# Patient Record
Sex: Male | Born: 1990 | Hispanic: No | Marital: Single | Smoking: Never smoker
Health system: Southern US, Community
[De-identification: ages and names within clinical notes are randomized; demographics above are authoritative.]

## PROBLEM LIST (undated history)

## (undated) DIAGNOSIS — G43909 Migraine, unspecified, not intractable, without status migrainosus: Secondary | ICD-10-CM

## (undated) DIAGNOSIS — K219 Gastro-esophageal reflux disease without esophagitis: Secondary | ICD-10-CM

## (undated) DIAGNOSIS — B019 Varicella without complication: Secondary | ICD-10-CM

## (undated) DIAGNOSIS — R519 Headache, unspecified: Secondary | ICD-10-CM

## (undated) DIAGNOSIS — R51 Headache: Secondary | ICD-10-CM

## (undated) HISTORY — DX: Headache, unspecified: R51.9

## (undated) HISTORY — DX: Gastro-esophageal reflux disease without esophagitis: K21.9

## (undated) HISTORY — DX: Headache: R51

## (undated) HISTORY — DX: Varicella without complication: B01.9

## (undated) HISTORY — DX: Migraine, unspecified, not intractable, without status migrainosus: G43.909

---

## 2015-07-05 ENCOUNTER — Ambulatory Visit (INDEPENDENT_AMBULATORY_CARE_PROVIDER_SITE_OTHER): Payer: BC Managed Care – PPO | Admitting: Adult Health

## 2015-07-05 ENCOUNTER — Encounter: Payer: Self-pay | Admitting: Adult Health

## 2015-07-05 VITALS — BP 100/76 | Temp 98.2°F | Ht 72.5 in | Wt 233.0 lb

## 2015-07-05 DIAGNOSIS — Z0000 Encounter for general adult medical examination without abnormal findings: Secondary | ICD-10-CM

## 2015-07-05 DIAGNOSIS — R51 Headache: Secondary | ICD-10-CM | POA: Diagnosis not present

## 2015-07-05 DIAGNOSIS — G43909 Migraine, unspecified, not intractable, without status migrainosus: Secondary | ICD-10-CM | POA: Insufficient documentation

## 2015-07-05 DIAGNOSIS — K219 Gastro-esophageal reflux disease without esophagitis: Secondary | ICD-10-CM

## 2015-07-05 DIAGNOSIS — R519 Headache, unspecified: Secondary | ICD-10-CM | POA: Insufficient documentation

## 2015-07-05 MED ORDER — PANTOPRAZOLE SODIUM 40 MG PO TBEC: 40.0000 mg | DELAYED_RELEASE_TABLET | Freq: Every day | ORAL | Status: DC

## 2015-07-05 NOTE — Progress Notes (Signed)
Pre visit review using our clinic review tool, if applicable. No additional management support is needed unless otherwise documented below in the visit note. 

## 2015-07-05 NOTE — Progress Notes (Signed)
HPI:  Riley Davis is here to establish care/Physical. He is a pleasant 24 year old male with PMH of migraines and frequent headaches  Last PCP and physical: 4+ years Immunizations:UTD Diet:Does not follow a diet. Eats a lot of fast food.  Exercise:Does not exercise.  Dentist: Goes twice a year Eye: Checked yearly.   Has the following chronic problems that require follow up and concerns today:  GERD - He takes Protonix for stomach pain and GERD like symptoms. He was placed on this about 2 weeks ago by Urgent Care. Stomach feels better since being on this medication.   Frequent headache - Happens once a week, usually at the end of the day and more so during the school year. He is able to control the headache with one advil. His last migraine was a year ago.    ROS negative for unless reported above: fevers, chills,feeling poorly, unintentional weight loss, hearing or vision loss, chest pain, palpitations, leg claudication, struggling to breath,Not feeling congested in the chest, no orthopenia, no cough,no wheezing, normal appetite, no soft tissue swelling, no hemoptysis, melena, hematochezia, hematuria, falls, loc, si, or thoughts of self harm.  Past Medical History  Diagnosis Date  . Migraines   . Chicken pox   . Frequent headaches   . GERD (gastroesophageal reflux disease)     No past surgical history on file.  Family History  Problem Relation Age of Onset  . Cancer Father     lung cancer    Social History   Social History  . Marital Status: Single    Spouse Name: N/A  . Number of Children: N/A  . Years of Education: N/A   Social History Main Topics  . Smoking status: Never Smoker   . Smokeless tobacco: None  . Alcohol Use: 0.0 oz/week    0 Standard drinks or equivalent per week     Comment: socailly   . Drug Use: No  . Sexual Activity: Not Asked   Other Topics Concern  . 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    Not married         Current outpatient prescriptions:  .  pantoprazole (PROTONIX) 40 MG tablet, Take 1 tablet (40 mg total) by mouth daily., Disp: 30 tablet, Rfl: 2  EXAM:  Filed Vitals:   07/05/15 1351  BP: 100/76  Temp: 98.2 F (36.8 C)    Body mass index is 31.15 kg/(m^2).  GENERAL: vitals reviewed and listed above, alert, oriented, appears well hydrated and in no acute distress. Slightly obese  HEENT: atraumatic, conjunttiva clear, no obvious abnormalities on inspection of external nose and ears  NECK: Neck is soft and supple without masses, no adenopathy or thyromegaly, trachea midline, no JVD. Normal range of motion.   LUNGS: clear to auscultation bilaterally, no wheezes, rales or rhonchi, good air movement  CV: Regular rate and rhythm, normal S1/S2, no audible murmurs, gallops, or rubs. No carotid bruit and no peripheral edema.   MS: moves all extremities without noticeable abnormality. No edema noted  Abd: soft/nontender/nondistended/normal bowel sounds   Skin: warm and dry, no rash   Extremities: No clubbing, cyanosis, or edema. Capillary refill is WNL. Pulses intact bilaterally in upper and lower extremities.   Neuro: CN II-XII intact, sensation and reflexes normal throughout, 5/5 muscle strength in bilateral upper and lower extremities. Normal finger to nose. Normal rapid alternating movements.  PSYCH: pleasant and cooperative, no obvious depression or anxiety  ASSESSMENT AND PLAN: 1. Routine general medical examination at a health care facility - Basic metabolic panel; Future - CBC; Future - Hemoglobin A1c; Future - Hepatic function panel; Future - Lipid panel; Future - POCT urinalysis dipstick; Future - TSH; Future - HIV antibody; Future - Will follow up with regarding labs - Follow up as needed or in 1-2 years for CPE - Needs to stop eating fast food and start exercising  2. Gastroesophageal reflux disease without esophagitis - pantoprazole (PROTONIX) 40 MG  tablet; Take 1 tablet (40 mg total) by mouth daily.  Dispense: 30 tablet; Refill: 2 - Will trial for three months and then take off - Basic metabolic panel; Future - CBC; Future - Hemoglobin A1c; Future - Hepatic function panel; Future - Lipid panel; Future - POCT urinalysis dipstick; Future - TSH; Future - HIV antibody; Future  3. Frequent headaches - Continue with current medication regimen. Inform provider if headaches become more frequent or are not relieved with OTC medication - Basic metabolic panel; Future - CBC; Future - Hemoglobin A1c; Future - Hepatic function panel; Future - Lipid panel; Future - POCT urinalysis dipstick; Future - TSH; Future - HIV antibody; Future   -We reviewed the PMH, PSH, FH, SH, Meds and Allergies. -We provided refills for any medications we will prescribe as needed. -We addressed current concerns per orders and patient instructions. -We have asked for records for pertinent exams, studies, vaccines and notes from previous providers. -We have advised patient to follow up per instructions below.   -Patient advised to return or notify a provider immediately if symptoms worsen or persist or new concerns arise.  Patient Instructions  It was great meeting you today!  I have sent in a prescription to the pharmacy for Protonix.   Cut back on fast food and start exercising.    I will follow up with you regarding your labs.    Health Maintenance A healthy lifestyle and preventative care can promote health and wellness.  Maintain regular health, dental, and eye exams.  Eat a healthy diet. Foods like vegetables, fruits, whole grains, low-fat dairy products, and lean protein foods contain the nutrients you need and are low in calories. Decrease your intake of foods high in solid fats, added sugars, and salt. Get information about a proper diet from your health care provider, if necessary.  Regular physical exercise is one of the most important  things you can do for your health. Most adults should get at least 150 minutes of moderate-intensity exercise (any activity that increases your heart rate and causes you to sweat) each week. In addition, most adults need muscle-strengthening exercises on 2 or more days a week.   Maintain a healthy weight. The body mass index (BMI) is a screening tool to identify possible weight problems. It provides an estimate of body fat based on height and weight. Your health care provider can find your BMI and can help you achieve or maintain a healthy weight. For males 20 years and older:  A BMI below 18.5 is considered underweight.  A BMI of 18.5 to 24.9 is normal.  A BMI of 25 to 29.9 is considered overweight.  A BMI of 30 and above is considered obese.  Maintain normal blood lipids and cholesterol by exercising and minimizing your intake of saturated fat. Eat a balanced diet with plenty of fruits and vegetables. Blood tests for lipids and cholesterol should begin at age 21 and be repeated every 5 years. If your lipid or cholesterol  levels are high, you are over age 76, or you are at high risk for heart disease, you may need your cholesterol levels checked more frequently.Ongoing high lipid and cholesterol levels should be treated with medicines if diet and exercise are not working.  If you smoke, find out from your health care provider how to quit. If you do not use tobacco, do not start.  Lung cancer screening is recommended for adults aged 55-80 years who are at high risk for developing lung cancer because of a history of smoking. A yearly low-dose CT scan of the lungs is recommended for people who have at least a 30-pack-year history of smoking and are current smokers or have quit within the past 15 years. A pack year of smoking is smoking an average of 1 pack of cigarettes a day for 1 year (for example, a 30-pack-year history of smoking could mean smoking 1 pack a day for 30 years or 2 packs a day for  15 years). Yearly screening should continue until the smoker has stopped smoking for at least 15 years. Yearly screening should be stopped for people who develop a health problem that would prevent them from having lung cancer treatment.  If you choose to drink alcohol, do not have more than 2 drinks per day. One drink is considered to be 12 oz (360 mL) of beer, 5 oz (150 mL) of wine, or 1.5 oz (45 mL) of liquor.  Avoid the use of street drugs. Do not share needles with anyone. Ask for help if you need support or instructions about stopping the use of drugs.  High blood pressure causes heart disease and increases the risk of stroke. Blood pressure should be checked at least every 1-2 years. Ongoing high blood pressure should be treated with medicines if weight loss and exercise are not effective.  If you are 14-86 years old, ask your health care provider if you should take aspirin to prevent heart disease.  Diabetes screening involves taking a blood sample to check your fasting blood sugar level. This should be done once every 3 years after age 32 if you are at a normal weight and without risk factors for diabetes. Testing should be considered at a younger age or be carried out more frequently if you are overweight and have at least 1 risk factor for diabetes.  Colorectal cancer can be detected and often prevented. Most routine colorectal cancer screening begins at the age of 38 and continues through age 56. However, your health care provider may recommend screening at an earlier age if you have risk factors for colon cancer. On a yearly basis, your health care provider may provide home test kits to check for hidden blood in the stool. A small camera at the end of a tube may be used to directly examine the colon (sigmoidoscopy or colonoscopy) to detect the earliest forms of colorectal cancer. Talk to your health care provider about this at age 53 when routine screening begins. A direct exam of the colon  should be repeated every 5-10 years through age 52, unless early forms of precancerous polyps or small growths are found.  People who are at an increased risk for hepatitis B should be screened for this virus. You are considered at high risk for hepatitis B if:  You were born in a country where hepatitis B occurs often. Talk with your health care provider about which countries are considered high risk.  Your parents were born in a high-risk country and  you have not received a shot to protect against hepatitis B (hepatitis B vaccine).  You have HIV or AIDS.  You use needles to inject street drugs.  You live with, or have sex with, someone who has hepatitis B.  You are a man who has sex with other men (MSM).  You get hemodialysis treatment.  You take certain medicines for conditions like cancer, organ transplantation, and autoimmune conditions.  Hepatitis C blood testing is recommended for all people born from [birth year] through 1965 and any individual with known risk factors for hepatitis C.  Healthy men should no longer receive prostate-specific antigen (PSA) blood tests as part of routine cancer screening. Talk to your health care provider about prostate cancer screening.  Testicular cancer screening is not recommended for adolescents or adult males who have no symptoms. Screening includes self-exam, a health care provider exam, and other screening tests. Consult with your health care provider about any symptoms you have or any concerns you have about testicular cancer.  Practice safe sex. Use condoms and avoid high-risk sexual practices to reduce the spread of sexually transmitted infections (STIs).  You should be screened for STIs, including gonorrhea and chlamydia if:  You are sexually active and are younger than 24 years.  You are older than 24 years, and your health care provider tells you that you are at risk for this type of infection.  Your sexual activity has changed since you  were last screened, and you are at an increased risk for chlamydia or gonorrhea. Ask your health care provider if you are at risk.  If you are at risk of being infected with HIV, it is recommended that you take a prescription medicine daily to prevent HIV infection. This is called pre-exposure prophylaxis (PrEP). You are considered at risk if:  You are a man who has sex with other men (MSM).  You are a heterosexual man who is sexually active with multiple partners.  You take drugs by injection.  You are sexually active with a partner who has HIV.  Talk with your health care provider about whether you are at high risk of being infected with HIV. If you choose to begin PrEP, you should first be tested for HIV. You should then be tested every 3 months for as long as you are taking PrEP.  Use sunscreen. Apply sunscreen liberally and repeatedly throughout the day. You should seek shade when your shadow is shorter than you. Protect yourself by wearing long sleeves, pants, a wide-brimmed hat, and sunglasses year round whenever you are outdoors.  Tell your health care provider of new moles or changes in moles, especially if there is a change in shape or color. Also, tell your health care provider if a mole is larger than the size of a pencil eraser.  A one-time screening for abdominal aortic aneurysm (AAA) and surgical repair of large AAAs by ultrasound is recommended for men aged 65-75 years who are current or former smokers.  Stay current with your vaccines (immunizations). Document Released: 05/02/2008 Document Revised: 11/09/2013 Document Reviewed: 04/01/2011 [facility] Patient Information 2015 [place], [place]. This information is not intended to replace advice given to you by your health care provider. Make sure you discuss any questions you have with your health care provider.      Shirline Frees, AGNP

## 2015-07-05 NOTE — Patient Instructions (Addendum)
It was great meeting you today!  I have sent in a prescription to the pharmacy for Protonix.   Cut back on fast food and start exercising.    I will follow up with you regarding your labs.    Health Maintenance A healthy lifestyle and preventative care can promote health and wellness.  Maintain regular health, dental, and eye exams.  Eat a healthy diet. Foods like vegetables, fruits, whole grains, low-fat dairy products, and lean protein foods contain the nutrients you need and are low in calories. Decrease your intake of foods high in solid fats, added sugars, and salt. Get information about a proper diet from your health care provider, if necessary.  Regular physical exercise is one of the most important things you can do for your health. Most adults should get at least 150 minutes of moderate-intensity exercise (any activity that increases your heart rate and causes you to sweat) each week. In addition, most adults need muscle-strengthening exercises on 2 or more days a week.   Maintain a healthy weight. The body mass index (BMI) is a screening tool to identify possible weight problems. It provides an estimate of body fat based on height and weight. Your health care provider can find your BMI and can help you achieve or maintain a healthy weight. For males 20 years and older:  A BMI below 18.5 is considered underweight.  A BMI of 18.5 to 24.9 is normal.  A BMI of 25 to 29.9 is considered overweight.  A BMI of 30 and above is considered obese.  Maintain normal blood lipids and cholesterol by exercising and minimizing your intake of saturated fat. Eat a balanced diet with plenty of fruits and vegetables. Blood tests for lipids and cholesterol should begin at age 26 and be repeated every 5 years. If your lipid or cholesterol levels are high, you are over age 7, or you are at high risk for heart disease, you may need your cholesterol levels checked more frequently.Ongoing high lipid  and cholesterol levels should be treated with medicines if diet and exercise are not working.  If you smoke, find out from your health care provider how to quit. If you do not use tobacco, do not start.  Lung cancer screening is recommended for adults aged 55-80 years who are at high risk for developing lung cancer because of a history of smoking. A yearly low-dose CT scan of the lungs is recommended for people who have at least a 30-pack-year history of smoking and are current smokers or have quit within the past 15 years. A pack year of smoking is smoking an average of 1 pack of cigarettes a day for 1 year (for example, a 30-pack-year history of smoking could mean smoking 1 pack a day for 30 years or 2 packs a day for 15 years). Yearly screening should continue until the smoker has stopped smoking for at least 15 years. Yearly screening should be stopped for people who develop a health problem that would prevent them from having lung cancer treatment.  If you choose to drink alcohol, do not have more than 2 drinks per day. One drink is considered to be 12 oz (360 mL) of beer, 5 oz (150 mL) of wine, or 1.5 oz (45 mL) of liquor.  Avoid the use of street drugs. Do not share needles with anyone. Ask for help if you need support or instructions about stopping the use of drugs.  High blood pressure causes heart disease and increases the  risk of stroke. Blood pressure should be checked at least every 1-2 years. Ongoing high blood pressure should be treated with medicines if weight loss and exercise are not effective.  If you are 50-54 years old, ask your health care provider if you should take aspirin to prevent heart disease.  Diabetes screening involves taking a blood sample to check your fasting blood sugar level. This should be done once every 3 years after age 79 if you are at a normal weight and without risk factors for diabetes. Testing should be considered at a younger age or be carried out more  frequently if you are overweight and have at least 1 risk factor for diabetes.  Colorectal cancer can be detected and often prevented. Most routine colorectal cancer screening begins at the age of 40 and continues through age 51. However, your health care provider may recommend screening at an earlier age if you have risk factors for colon cancer. On a yearly basis, your health care provider may provide home test kits to check for hidden blood in the stool. A small camera at the end of a tube may be used to directly examine the colon (sigmoidoscopy or colonoscopy) to detect the earliest forms of colorectal cancer. Talk to your health care provider about this at age 24 when routine screening begins. A direct exam of the colon should be repeated every 5-10 years through age 51, unless early forms of precancerous polyps or small growths are found.  People who are at an increased risk for hepatitis B should be screened for this virus. You are considered at high risk for hepatitis B if:  You were born in a country where hepatitis B occurs often. Talk with your health care provider about which countries are considered high risk.  Your parents were born in a high-risk country and you have not received a shot to protect against hepatitis B (hepatitis B vaccine).  You have HIV or AIDS.  You use needles to inject street drugs.  You live with, or have sex with, someone who has hepatitis B.  You are a man who has sex with other men (MSM).  You get hemodialysis treatment.  You take certain medicines for conditions like cancer, organ transplantation, and autoimmune conditions.  Hepatitis C blood testing is recommended for all people born from [birth year] through 1964 and any individual with known risk factors for hepatitis C.  Healthy men should no longer receive prostate-specific antigen (PSA) blood tests as part of routine cancer screening. Talk to your health care provider about prostate cancer  screening.  Testicular cancer screening is not recommended for adolescents or adult males who have no symptoms. Screening includes self-exam, a health care provider exam, and other screening tests. Consult with your health care provider about any symptoms you have or any concerns you have about testicular cancer.  Practice safe sex. Use condoms and avoid high-risk sexual practices to reduce the spread of sexually transmitted infections (STIs).  You should be screened for STIs, including gonorrhea and chlamydia if:  You are sexually active and are younger than 24 years.  You are older than 24 years, and your health care provider tells you that you are at risk for this type of infection.  Your sexual activity has changed since you were last screened, and you are at an increased risk for chlamydia or gonorrhea. Ask your health care provider if you are at risk.  If you are at risk of being infected with HIV, it is  recommended that you take a prescription medicine daily to prevent HIV infection. This is called pre-exposure prophylaxis (PrEP). You are considered at risk if:  You are a man who has sex with other men (MSM).  You are a heterosexual man who is sexually active with multiple partners.  You take drugs by injection.  You are sexually active with a partner who has HIV.  Talk with your health care provider about whether you are at high risk of being infected with HIV. If you choose to begin PrEP, you should first be tested for HIV. You should then be tested every 3 months for as long as you are taking PrEP.  Use sunscreen. Apply sunscreen liberally and repeatedly throughout the day. You should seek shade when your shadow is shorter than you. Protect yourself by wearing long sleeves, pants, a wide-brimmed hat, and sunglasses year round whenever you are outdoors.  Tell your health care provider of new moles or changes in moles, especially if there is a change in shape or color. Also, tell  your health care provider if a mole is larger than the size of a pencil eraser.  A one-time screening for abdominal aortic aneurysm (AAA) and surgical repair of large AAAs by ultrasound is recommended for men aged 22-75 years who are current or former smokers.  Stay current with your vaccines (immunizations). Document Released: 05/02/2008 Document Revised: 11/09/2013 Document Reviewed: 04/01/2011 [facility] Patient Information 2015 [place], [place]. This information is not intended to replace advice given to you by your health care provider. Make sure you discuss any questions you have with your health care provider.

## 2015-07-06 ENCOUNTER — Other Ambulatory Visit (INDEPENDENT_AMBULATORY_CARE_PROVIDER_SITE_OTHER): Payer: BC Managed Care – PPO

## 2015-07-06 DIAGNOSIS — R519 Headache, unspecified: Secondary | ICD-10-CM

## 2015-07-06 DIAGNOSIS — R51 Headache: Secondary | ICD-10-CM

## 2015-07-06 DIAGNOSIS — K219 Gastro-esophageal reflux disease without esophagitis: Secondary | ICD-10-CM | POA: Diagnosis not present

## 2015-07-06 DIAGNOSIS — Z0000 Encounter for general adult medical examination without abnormal findings: Secondary | ICD-10-CM

## 2015-07-06 LAB — POCT URINALYSIS DIPSTICK
BILIRUBIN UA: NEGATIVE
Blood, UA: NEGATIVE
GLUCOSE UA: NEGATIVE
KETONES UA: NEGATIVE
LEUKOCYTES UA: NEGATIVE
Nitrite, UA: NEGATIVE
Protein, UA: NEGATIVE
Spec Grav, UA: 1.02
Urobilinogen, UA: 0.2
pH, UA: 7

## 2015-07-06 LAB — BASIC METABOLIC PANEL
BUN: 15 mg/dL (ref 6–23)
CALCIUM: 10.2 mg/dL (ref 8.4–10.5)
CHLORIDE: 102 meq/L (ref 96–112)
CO2: 30 meq/L (ref 19–32)
Creatinine, Ser: 0.96 mg/dL (ref 0.40–1.50)
GFR: 102.68 mL/min (ref >60.00)
GLUCOSE: 101 mg/dL — AB (ref 70–99)
Potassium: 4.5 mEq/L (ref 3.5–5.1)
SODIUM: 140 meq/L (ref 135–145)

## 2015-07-06 LAB — LIPID PANEL
CHOLESTEROL: 228 mg/dL — AB (ref 0–200)
HDL: 46.6 mg/dL (ref >39.00)
LDL Cholesterol: 157 mg/dL — ABNORMAL HIGH (ref 0–99)
NonHDL: 181.08
TRIGLYCERIDES: 122 mg/dL (ref 0.0–149.0)
Total CHOL/HDL Ratio: 5
VLDL: 24.4 mg/dL (ref 0.0–40.0)

## 2015-07-06 LAB — HEPATIC FUNCTION PANEL
ALBUMIN: 4.7 g/dL (ref 3.5–5.2)
ALT: 25 U/L (ref 0–53)
AST: 16 U/L (ref 0–37)
Alkaline Phosphatase: 68 U/L (ref 39–117)
Bilirubin, Direct: 0.1 mg/dL (ref 0.0–0.3)
TOTAL PROTEIN: 7.9 g/dL (ref 6.0–8.3)
Total Bilirubin: 0.6 mg/dL (ref 0.2–1.2)

## 2015-07-06 LAB — CBC
HEMATOCRIT: 50.1 % (ref 39.0–52.0)
Hemoglobin: 16.8 g/dL (ref 13.0–17.0)
MCHC: 33.6 g/dL (ref 30.0–36.0)
MCV: 90 fl (ref 78.0–100.0)
PLATELETS: 308 10*3/uL (ref 150.0–400.0)
RBC: 5.57 Mil/uL (ref 4.22–5.81)
RDW: 13.5 % (ref 11.5–15.5)
WBC: 7.1 10*3/uL (ref 4.0–10.5)

## 2015-07-06 LAB — TSH: TSH: 2.38 u[IU]/mL (ref 0.35–4.50)

## 2015-07-06 LAB — HEMOGLOBIN A1C: Hgb A1c MFr Bld: 5.5 % (ref 4.6–6.5)

## 2015-07-07 ENCOUNTER — Encounter: Payer: Self-pay | Admitting: Adult Health

## 2015-07-07 LAB — HIV ANTIBODY (ROUTINE TESTING W REFLEX): HIV 1&2 Ab, 4th Generation: NONREACTIVE

## 2015-08-19 ENCOUNTER — Encounter: Payer: Self-pay | Admitting: Adult Health

## 2015-09-22 ENCOUNTER — Encounter: Payer: Self-pay | Admitting: Adult Health

## 2015-09-29 ENCOUNTER — Encounter: Payer: Self-pay | Admitting: Adult Health

## 2015-09-29 ENCOUNTER — Ambulatory Visit (INDEPENDENT_AMBULATORY_CARE_PROVIDER_SITE_OTHER): Payer: BC Managed Care – PPO | Admitting: Adult Health

## 2015-09-29 VITALS — BP 110/80 | Temp 98.0°F | Ht 72.5 in | Wt 227.0 lb

## 2015-09-29 DIAGNOSIS — K219 Gastro-esophageal reflux disease without esophagitis: Secondary | ICD-10-CM

## 2015-09-29 DIAGNOSIS — G43809 Other migraine, not intractable, without status migrainosus: Secondary | ICD-10-CM

## 2015-09-29 MED ORDER — SUMATRIPTAN SUCCINATE 50 MG PO TABS: 50.0000 mg | ORAL_TABLET | ORAL | Status: DC | PRN

## 2015-09-29 NOTE — Patient Instructions (Signed)
It was great seeing you again!   Congrats on the weight loss, keep up the good work.   Use the Imitrex as soon as you feel the Migraine comes on. If this does not work for you, please let me know.

## 2015-09-29 NOTE — Progress Notes (Signed)
Pre visit review using our clinic review tool, if applicable. No additional management support is needed unless otherwise documented below in the visit note. 

## 2015-09-29 NOTE — Progress Notes (Signed)
Subjective:    Patient ID: Riley BerlinJoshua Choe Brackeen, male    DOB: 06/24/1991, 24 y.o.   MRN: [id number]  HPI 24 year old male who presents to the office today for follow up regarding his migraines and GERD.   GERD He has changed his diet and is eating more chicken and vegetables. He feels like his GERD has improved. He has lost six pounds since his last visit.   Migraines,  He continues to suffer from migraines. His last migraine was at the beginning of October and he had it for three days. He was using Excedrin migraine which was not helping. He does not get aura. He did not have blurred vision.     Review of Systems  Eyes: Negative.   Respiratory: Negative.   Gastrointestinal: Negative.   Endocrine: Negative.   Genitourinary: Negative.   Musculoskeletal: Negative.   Skin: Negative.   Neurological: Positive for headaches.  Hematological: Negative.   Psychiatric/Behavioral: Negative.   All other systems reviewed and are negative.  Past Medical History  Diagnosis Date  . Migraines   . Chicken pox   . Frequent headaches   . GERD (gastroesophageal reflux disease)     Social History   Social History  . Marital Status: Single    Spouse Name: N/A  . Number of Children: N/A  . Years of Education: N/A   Occupational History  . Not on file.   Social History Main Topics  . Smoking status: Never Smoker   . Smokeless tobacco: Not on file  . Alcohol Use: 0.0 oz/week    0 Standard drinks or equivalent per week     Comment: socailly   . Drug Use: No  . Sexual Activity: Not on file   Other Topics Concern  . 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    Not married       No past surgical history on file.  Family History  Problem Relation Age of Onset  . Cancer Father     lung cancer    No Known Allergies  Current Outpatient Prescriptions on File Prior to Visit  Medication Sig Dispense Refill  . pantoprazole (PROTONIX) 40 MG tablet Take  1 tablet (40 mg total) by mouth daily. 30 tablet 2   No current facility-administered medications on file prior to visit.    BP 110/80 mmHg  Temp(Src) 98 F (36.7 C) (Oral)  Ht 6' 0.5" (1.842 m)  Wt 227 lb (102.967 kg)  BMI 30.35 kg/m2       Objective:   Physical Exam  Constitutional: He is oriented to person, place, and time. He appears well-developed and well-nourished. No distress.  Cardiovascular: Normal rate, regular rhythm, normal heart sounds and intact distal pulses.  Exam reveals no friction rub.   No murmur heard. Pulmonary/Chest: Effort normal and breath sounds normal. No respiratory distress. He has no wheezes. He has no rales. He exhibits no tenderness.  Neurological: He is alert and oriented to person, place, and time.  Skin: Skin is warm and dry. No rash noted. He is not diaphoretic. No erythema. No pallor.  Psychiatric: He has a normal mood and affect. His behavior is normal. Judgment and thought content normal.  Nursing note and vitals reviewed.      Assessment & Plan:  1. Other type of nonintractable migraine - No migraines in the last 30 days.  - SUMAtriptan (IMITREX) 50 MG tablet; Take 1 tablet (50  mg total) by mouth every 2 (two) hours as needed for migraine. May repeat in 2 hours if headache persists or recurs.  Dispense: 10 tablet; Refill: 3  2. Gastroesophageal reflux disease without esophagitis - Much improved since being on protonix. Continue with weight loss. Hopefully we can take him off the protonix soon.

## 2015-10-04 ENCOUNTER — Telehealth: Payer: Self-pay | Admitting: Adult Health

## 2015-10-04 NOTE — Telephone Encounter (Signed)
Pt had another 'spell' at work today. Staff RN took vitals: HR 94, O2 99% BP was 142/92 at 1:15PM. His spell happened at 11:40. Should he make another appointment?

## 2015-10-05 ENCOUNTER — Telehealth: Payer: Self-pay | Admitting: Adult Health

## 2015-10-05 ENCOUNTER — Other Ambulatory Visit: Payer: Self-pay | Admitting: Adult Health

## 2015-10-05 MED ORDER — RANITIDINE HCL 300 MG PO TABS: 150.0000 mg | ORAL_TABLET | Freq: Every day | ORAL | Status: DC

## 2015-10-05 NOTE — Telephone Encounter (Signed)
PA for Pantoprazole 40mg  was denied. Has to fail a 30 day trial of high dose H2 blockers.

## 2015-10-05 NOTE — Telephone Encounter (Signed)
Zantac 300mg  sent in

## 2015-10-06 NOTE — Telephone Encounter (Signed)
Can we find out how often he is getting dizzy or lightheaded? If it is happening often then have him come and and we can talk about setting him up with an event monitor.

## 2015-10-06 NOTE — Telephone Encounter (Signed)
Called and spoke with pt and pt states it is whenever he gets really hot.  The last one was 2 days ago and last one was the Friday leading up to the Tuesday. Advised pt to come in to discuss an event monitor.  Pt is aware and will come in on 11.23.2016 at 8 am.

## 2015-10-11 ENCOUNTER — Encounter: Payer: Self-pay | Admitting: Adult Health

## 2015-10-11 ENCOUNTER — Ambulatory Visit (INDEPENDENT_AMBULATORY_CARE_PROVIDER_SITE_OTHER): Payer: BC Managed Care – PPO | Admitting: Adult Health

## 2015-10-11 VITALS — BP 108/80 | HR 80 | Temp 97.7°F | Ht 72.5 in | Wt 226.3 lb

## 2015-10-11 DIAGNOSIS — R42 Dizziness and giddiness: Secondary | ICD-10-CM | POA: Diagnosis not present

## 2015-10-11 DIAGNOSIS — H6591 Unspecified nonsuppurative otitis media, right ear: Secondary | ICD-10-CM | POA: Diagnosis not present

## 2015-10-11 LAB — CBC WITH DIFFERENTIAL/PLATELET
BASOS ABS: 0.1 10*3/uL (ref 0.0–0.1)
Basophils Relative: 0.6 % (ref 0.0–3.0)
EOS PCT: 3.2 % (ref 0.0–5.0)
Eosinophils Absolute: 0.3 10*3/uL (ref 0.0–0.7)
HEMATOCRIT: 50 % (ref 39.0–52.0)
Hemoglobin: 16.7 g/dL (ref 13.0–17.0)
LYMPHS ABS: 1.8 10*3/uL (ref 0.7–4.0)
LYMPHS PCT: 21.5 % (ref 12.0–46.0)
MCHC: 33.4 g/dL (ref 30.0–36.0)
MCV: 89 fl (ref 78.0–100.0)
MONOS PCT: 7.3 % (ref 3.0–12.0)
Monocytes Absolute: 0.6 10*3/uL (ref 0.1–1.0)
NEUTROS ABS: 5.7 10*3/uL (ref 1.4–7.7)
Neutrophils Relative %: 67.4 % (ref 43.0–77.0)
PLATELETS: 335 10*3/uL (ref 150.0–400.0)
RBC: 5.62 Mil/uL (ref 4.22–5.81)
RDW: 12.8 % (ref 11.5–15.5)
WBC: 8.4 10*3/uL (ref 4.0–10.5)

## 2015-10-11 LAB — TSH: TSH: 2.41 u[IU]/mL (ref 0.35–4.50)

## 2015-10-11 MED ORDER — AMOXICILLIN-POT CLAVULANATE 875-125 MG PO TABS: 1.0000 | ORAL_TABLET | Freq: Two times a day (BID) | ORAL | Status: DC

## 2015-10-11 NOTE — Progress Notes (Signed)
Subjective:    Patient ID: Riley Davis, male    DOB: 01/01/1991, 24 y.o.   MRN: [id number]  HPI  24 year old male who presents to the office today for dizziness. Over the last two weeks he has had two episodes while at school where he all of a sudden feels warm, becomes diaphoretic and has to sit down because he feels like he is going to pass out. He denies any events that precipitate his dizzy spells. Denies any feeling of palpitations, heart racing, chest pain, or SOB before these episodes. 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 Endorses that he does not handle hot room temperatures very well and it has been hot in the school building as of recently.   He endorses that he has had the band of pressure across the front of his head for the last week. Has been taking Imitrex which has helped slightly. Denies any ear pain or ear drainage.    Review of Systems  Constitutional: Negative.   Respiratory: Negative.   Cardiovascular: Negative.   Endocrine: Positive for heat intolerance.  Genitourinary: Negative.   Musculoskeletal: Negative.   Neurological: Positive for dizziness and headaches. Negative for weakness.  Psychiatric/Behavioral: Negative.   All other systems reviewed and are negative.  Past Medical History  Diagnosis Date  . Migraines   . Chicken pox   . Frequent headaches   . GERD (gastroesophageal reflux disease)     Social History   Social History  . Marital Status: Single    Spouse Name: N/A  . Number of Children: N/A  . Years of Education: N/A   Occupational History  . Not on file.   Social History Main Topics  . Smoking status: Never Smoker   . Smokeless tobacco: Not on file  . Alcohol Use: 0.0 oz/week    0 Standard drinks or equivalent per week     Comment: socailly   . Drug Use: No  . Sexual Activity: Not on file   Other Topics Concern  . 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    Not married       No past surgical history on file.  Family History  Problem Relation Age of Onset  . Cancer Father     lung cancer    No Known Allergies  Current Outpatient Prescriptions on File Prior to Visit  Medication Sig Dispense Refill  . pantoprazole (PROTONIX) 40 MG tablet Take 1 tablet (40 mg total) by mouth daily. 30 tablet 2  . ranitidine (ZANTAC) 300 MG tablet Take 0.5 tablets (150 mg total) by mouth at bedtime. 30 tablet 3  . SUMAtriptan (IMITREX) 50 MG tablet Take 1 tablet (50 mg total) by mouth every 2 (two) hours as needed for migraine. May repeat in 2 hours if headache persists or recurs. 10 tablet 3   No current facility-administered medications on file prior to visit.    BP 108/80 mmHg  Pulse 80  Temp(Src) 97.7 F (36.5 C) (Oral)  Ht 6' 0.5" (1.842 m)  Wt 226 lb 4.8 oz (102.649 kg)  BMI 30.25 kg/m2  SpO2 98%       Objective:   Physical Exam  Constitutional: He is oriented to person, place, and time. He appears well-developed and well-nourished. No distress.  HENT:  Head: Normocephalic and atraumatic.  Right Ear: External ear normal.  Left Ear: External ear normal.  Nose:  Nose normal.  Mouth/Throat: Oropharynx is clear and moist.  Cerumen impaction in bilateral ears. Once impaction was removed, his right TM was red and bulging.   Eyes: Conjunctivae and EOM are normal. Pupils are equal, round, and reactive to light. Right eye exhibits no discharge. Left eye exhibits no discharge.  Neck: Normal range of motion. Neck supple. No thyromegaly present.  Cardiovascular: Normal rate, regular rhythm, normal heart sounds and intact distal pulses.  Exam reveals no gallop and no friction rub.   No murmur heard. Pulmonary/Chest: Effort normal and breath sounds normal. No respiratory distress. He has no wheezes. He has no rales. He exhibits no tenderness.  Abdominal: Bowel sounds are normal. He exhibits no distension and no mass. There is no tenderness. There is  no rebound and no guarding.  Lymphadenopathy:    He has no cervical adenopathy.  Neurological: He is alert and oriented to person, place, and time.  Skin: Skin is warm and dry. No rash noted. He is not diaphoretic. No erythema. No pallor.  Psychiatric: He has a normal mood and affect. His behavior is normal. Judgment and thought content normal.  Vitals reviewed.      Assessment & Plan:  1. Dizziness - Do not think it is cardiac at this point. Will consider a holter monitor in the future if symptoms continue. May be related to Migraines. He would like to continue to try imitrex. Will consider referral to headache clinic.  - TSH - CBC with Differential/Platelet - Follow up if no improvement  2. Otitis media with effusion, right - amoxicillin-clavulanate (AUGMENTIN) 875-125 MG tablet; Take 1 tablet by mouth 2 (two) times daily.  Dispense: 20 tablet; Refill: 0

## 2015-10-11 NOTE — Patient Instructions (Addendum)
I have sent in a prescription for Augmentin- take this twice a day for 10 days.   I will follow up with you regarding your lab work.   Please let me know if these spells continue

## 2015-10-11 NOTE — Progress Notes (Signed)
Pre visit review using our clinic review tool, if applicable. No additional management support is needed unless otherwise documented below in the visit note. 

## 2015-10-16 ENCOUNTER — Encounter: Payer: Self-pay | Admitting: Adult Health

## 2015-10-17 ENCOUNTER — Encounter: Payer: Self-pay | Admitting: Adult Health

## 2015-10-17 ENCOUNTER — Ambulatory Visit (INDEPENDENT_AMBULATORY_CARE_PROVIDER_SITE_OTHER): Payer: BC Managed Care – PPO | Admitting: Adult Health

## 2015-10-17 VITALS — BP 114/80 | HR 104 | Temp 98.2°F | Ht 72.5 in | Wt 225.3 lb

## 2015-10-17 DIAGNOSIS — R42 Dizziness and giddiness: Secondary | ICD-10-CM

## 2015-10-17 DIAGNOSIS — K219 Gastro-esophageal reflux disease without esophagitis: Secondary | ICD-10-CM

## 2015-10-17 DIAGNOSIS — G43809 Other migraine, not intractable, without status migrainosus: Secondary | ICD-10-CM | POA: Diagnosis not present

## 2015-10-17 DIAGNOSIS — R1033 Periumbilical pain: Secondary | ICD-10-CM | POA: Diagnosis not present

## 2015-10-17 MED ORDER — PANTOPRAZOLE SODIUM 40 MG PO TBEC: 40.0000 mg | DELAYED_RELEASE_TABLET | Freq: Two times a day (BID) | ORAL | Status: DC

## 2015-10-17 NOTE — Progress Notes (Signed)
Pre visit review using our clinic review tool, if applicable. No additional management support is needed unless otherwise documented below in the visit note. 

## 2015-10-17 NOTE — Patient Instructions (Signed)
I am sorry you have to keep going through this   Someone will call you to schedule a ultra sound, set you up with the event monitor, and get you over to the headache clinic.   Take the Protonix twice a day.   Try your best to keep a diary of these events.

## 2015-10-17 NOTE — Progress Notes (Signed)
Subjective:    Patient ID: Riley Davis, male    DOB: January 14, 1991, 24 y.o.   MRN: [id number]  HPI  24 year old male who returns to the office today for continued symptom of dizziness.   2 days ago ( Sunday) he was at home, when he woke up his stomach " was feeling bad with a lot of nausea in the morning." The nausea was gone by the afternoon and he had a normal appetite. The day after ( Monday) he woke up in the morning and his stomach felt better. 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 He was able to go outside into the cool weather, once outside he felt better. This episode lasted about 20 minutes. During this episode he felt nauseated. Also on Monday he had a migraine, which he took one Imitrex, he endorses that this did not help. Continues to have a band of pressure across his head and in his right ear.   When he woke up his stomach felt " a little unsettled" and endorses having intermittent bouts of diarrhea with his nausea. He has not had a fever  He denies feeling like his heart was going to beat out of his chest, like he was having extra heart beats ,syncope or vomiting.   He has not completed his Amoxicillin treatment for otitis media with effusion. Has ran out of protonix and feels as though this helped with his stomach issues.     Review of Systems  Constitutional: Negative.   Respiratory: Negative.   Cardiovascular: Negative.   Gastrointestinal: Positive for nausea and diarrhea. Negative for vomiting, abdominal pain, constipation, blood in stool, abdominal distention and anal bleeding.  Neurological: Positive for dizziness and headaches. Negative for seizures, facial asymmetry, speech difficulty, weakness, light-headedness and numbness.  Hematological: Negative.   Psychiatric/Behavioral: Negative.   All other systems reviewed and are negative.      Objective:   Physical Exam    Constitutional: He is oriented to person, place, and time. He appears well-developed and well-nourished. No distress.  HENT:  Head: Normocephalic and atraumatic.  Right Ear: External ear normal.  Left Ear: External ear normal.  Nose: Nose normal.  Mouth/Throat: Oropharynx is clear and moist. No oropharyngeal exudate.  Otitis media has resolved  Eyes: Conjunctivae and EOM are normal. Pupils are equal, round, and reactive to light. Right eye exhibits no discharge. Left eye exhibits no discharge. No scleral icterus.  Neck: Normal range of motion. Neck supple. No thyromegaly present.  Cardiovascular: Normal rate, regular rhythm, normal heart sounds and intact distal pulses.  Exam reveals no gallop and no friction rub.   No murmur heard. Pulmonary/Chest: Effort normal and breath sounds normal. No respiratory distress. He has no wheezes. He has no rales. He exhibits no tenderness.  Abdominal: Soft. Bowel sounds are normal. He exhibits no distension and no mass. There is tenderness (with deep palpation to umbilical area). There is no rebound and no guarding.  Musculoskeletal: Normal range of motion. He exhibits no edema or tenderness.  Lymphadenopathy:    He has no cervical adenopathy.  Neurological: He is alert and oriented to person, place, and time.  Skin: Skin is warm and dry. No rash noted. He is not diaphoretic. No erythema. No pallor.  Psychiatric: He has a normal mood and affect. His behavior is normal. Judgment and thought content normal.  Nursing note and vitals reviewed.  Assessment & Plan:  1. Dizziness - I still do not think this is cardiac related. From exam and patient interview it appears to be more of a temperature intolerance. His TSH is normal.  - Advised to try opening windows in class room. Will r/o cardiac issue with event monitor.  - Cardiac event monitor  2. Gastroesophageal reflux disease without esophagitis - pantoprazole (PROTONIX) 40 MG tablet; Take 1 tablet  (40 mg total) by mouth 2 (two) times daily.  Dispense: 60 tablet; Refill: 6  3. Periumbilical abdominal pain - Possible gastric ulcer or colitis?. He has no fevers and does not feel acutely ill. Very little suspicion for early appendicitis.  - US Abdomen Complete; Future  4. Other type of nonintractable migraine - AMB referral to headache clinic

## 2015-10-18 ENCOUNTER — Telehealth: Payer: Self-pay | Admitting: Adult Health

## 2015-10-18 ENCOUNTER — Emergency Department (HOSPITAL_COMMUNITY): Payer: BC Managed Care – PPO

## 2015-10-18 ENCOUNTER — Emergency Department (HOSPITAL_COMMUNITY)
Admission: EM | Admit: 2015-10-18 | Discharge: 2015-10-18 | Disposition: A | Discharge status: Home / Self Care | Payer: BC Managed Care – PPO | Attending: Emergency Medicine | Admitting: Emergency Medicine

## 2015-10-18 ENCOUNTER — Encounter: Payer: Self-pay | Admitting: Adult Health

## 2015-10-18 ENCOUNTER — Encounter (HOSPITAL_COMMUNITY): Payer: Self-pay

## 2015-10-18 DIAGNOSIS — R634 Abnormal weight loss: Secondary | ICD-10-CM | POA: Insufficient documentation

## 2015-10-18 DIAGNOSIS — Z79899 Other long term (current) drug therapy: Secondary | ICD-10-CM | POA: Insufficient documentation

## 2015-10-18 DIAGNOSIS — Z792 Long term (current) use of antibiotics: Secondary | ICD-10-CM | POA: Insufficient documentation

## 2015-10-18 DIAGNOSIS — K219 Gastro-esophageal reflux disease without esophagitis: Secondary | ICD-10-CM | POA: Insufficient documentation

## 2015-10-18 DIAGNOSIS — Z8619 Personal history of other infectious and parasitic diseases: Secondary | ICD-10-CM | POA: Diagnosis not present

## 2015-10-18 DIAGNOSIS — G43909 Migraine, unspecified, not intractable, without status migrainosus: Secondary | ICD-10-CM | POA: Diagnosis not present

## 2015-10-18 DIAGNOSIS — R1110 Vomiting, unspecified: Secondary | ICD-10-CM | POA: Insufficient documentation

## 2015-10-18 DIAGNOSIS — R110 Nausea: Secondary | ICD-10-CM

## 2015-10-18 LAB — COMPREHENSIVE METABOLIC PANEL
ALBUMIN: 4.4 g/dL (ref 3.5–5.0)
ALT: 32 U/L (ref 17–63)
ANION GAP: 8 (ref 5–15)
AST: 20 U/L (ref 15–41)
Alkaline Phosphatase: 65 U/L (ref 38–126)
BUN: 10 mg/dL (ref 6–20)
CO2: 27 mmol/L (ref 22–32)
Calcium: 9.5 mg/dL (ref 8.9–10.3)
Chloride: 103 mmol/L (ref 101–111)
Creatinine, Ser: 0.95 mg/dL (ref 0.61–1.24)
GFR calc non Af Amer: >60 mL/min (ref >60)
GLUCOSE: 101 mg/dL — AB (ref 65–99)
POTASSIUM: 3.6 mmol/L (ref 3.5–5.1)
SODIUM: 138 mmol/L (ref 135–145)
TOTAL PROTEIN: 7.9 g/dL (ref 6.5–8.1)
Total Bilirubin: 0.9 mg/dL (ref 0.3–1.2)

## 2015-10-18 LAB — CBC WITH DIFFERENTIAL/PLATELET
BASOS ABS: 0 10*3/uL (ref 0.0–0.1)
Basophils Relative: 0 %
Eosinophils Absolute: 0.1 10*3/uL (ref 0.0–0.7)
Eosinophils Relative: 1 %
HEMATOCRIT: 49 % (ref 39.0–52.0)
HEMOGLOBIN: 16.4 g/dL (ref 13.0–17.0)
LYMPHS PCT: 15 %
Lymphs Abs: 1.4 10*3/uL (ref 0.7–4.0)
MCH: 29.9 pg (ref 26.0–34.0)
MCHC: 33.5 g/dL (ref 30.0–36.0)
MCV: 89.4 fL (ref 78.0–100.0)
MONO ABS: 0.5 10*3/uL (ref 0.1–1.0)
MONOS PCT: 5 %
NEUTROS ABS: 7.1 10*3/uL (ref 1.7–7.7)
NEUTROS PCT: 79 %
PLATELETS: 309 10*3/uL (ref 150–400)
RBC: 5.48 MIL/uL (ref 4.22–5.81)
RDW: 12.5 % (ref 11.5–15.5)
WBC: 9.1 10*3/uL (ref 4.0–10.5)

## 2015-10-18 LAB — URINALYSIS, ROUTINE W REFLEX MICROSCOPIC
Bilirubin Urine: NEGATIVE
Glucose, UA: NEGATIVE mg/dL
Hgb urine dipstick: NEGATIVE
Ketones, ur: NEGATIVE mg/dL
Leukocytes, UA: NEGATIVE
NITRITE: NEGATIVE
PROTEIN: NEGATIVE mg/dL
SPECIFIC GRAVITY, URINE: 1.014 (ref 1.005–1.030)
pH: 6 (ref 5.0–8.0)

## 2015-10-18 MED ORDER — ONDANSETRON HCL 4 MG PO TABS: 4.0000 mg | ORAL_TABLET | Freq: Four times a day (QID) | ORAL | Status: DC

## 2015-10-18 MED ORDER — ONDANSETRON 4 MG PO TBDP
4.0000 mg | ORAL_TABLET | Freq: Once | ORAL | Status: AC
  Administered 2015-10-18: 4 mg via ORAL
  Filled 2015-10-18: qty 1

## 2015-10-18 NOTE — Telephone Encounter (Signed)
Patient Name: Riley Davis  DOB: 03/18/1991    Initial Comment caller states head pressure and bad nausea   Nurse Assessment  Nurse: Renaldo FiddlerAdkins, RN, Raynelle FanningJulie Date/Time Lamount Cohen(Eastern Time): 10/18/2015 9:00:50 AM  Confirm and document reason for call. If symptomatic, describe symptoms. ---Caller states he has sinus pain and pressure an he is nauseated. States his throat feels scratchy an his eyes are red. Adds he was seen in the office on Tuesday for all of these sx. Adds he was told he was going to get a referral to a "headache clinic". Originally states he was calling to FU on ultrasound for ongoing nausea, feeling faint and dizzy and migraines. Now states he is calling this morning because nausea is worse. Caller is discussing multiple medical issues, will not confirm what the outcome of his office visit was yesterday except to say he was given abx he "thinks for his sinus infection ".  Has the patient traveled out of the country within the last 30 days? ---Not Applicable  Does the patient have any new or worsening symptoms? ---Yes  Will a triage be completed? ---Yes  Related visit to physician within the last 2 weeks? ---Yes  Does the PT have any chronic conditions? (i.e. diabetes, asthma, etc.) ---Yes  List chronic conditions. ---Hx Migraines, Hx of nausea/diarrhea, Current sinus infection(?), Hx "feeling faint, dizzy and hot", GERD.  Is this a behavioral health call? ---No     Guidelines    Guideline Title Affirmed Question Affirmed Notes  Nausea Nausea persists > 1 week    Final Disposition User   See PCP When Office is Open (within 3 days) Renaldo FiddlerAdkins, RN, Raynelle FanningJulie    Comments  Late entry due to computer issues **. Ivin BootyJoshua states at end of all that he is planning to go to the ED to get "his ultrasound and some answers" Declined FU with PCP or appointment. He verbalized understanding of all instructions and information and will cb as needed.   Referrals  [facility] - ED   Disagree/Comply: Disagree   Disagree/Comply Reason: Disagree with instructions

## 2015-10-18 NOTE — ED Notes (Signed)
Pt alert and oriented x4. Respirations even and unlabored, bilateral symmetrical rise and fall of chest. Skin warm and dry. In no acute distress. Denies needs.   

## 2015-10-18 NOTE — ED Provider Notes (Signed)
CSN: [id number]     Arrival date & time 10/18/15  [id number] History   First MD Initiated Contact with Patient 10/18/15 1000     Chief Complaint  Patient presents with  . Nausea     The history is provided by the patient. No language interpreter was used.   Amedeo GoryJoshua Choe Lindquist is a 24 y.o. male who presents to the Emergency Department complaining of nausea.  He reports 3 months of progressive nausea described as a queasy type sensation. He has no change event in his appetite or eating patterns but reports a 7 pound weight loss over the last 3 months. No abdominal pain, fevers, chest pain, shortness of breath, dysuria. No night sweats. He is had intermittent diarrhea for the last 2 weeks. He has a history of migraine headaches cease had some head pressure for the last week, not as bad as a headache. No numbness or weakness. He saw his PCP who ordered lab work that was within normal limits. He is scheduled for an outpatient ultrasound. His father has a history of renal cell cancer.   Past Medical History  Diagnosis Date  . Migraines   . Chicken pox   . Frequent headaches   . GERD (gastroesophageal reflux disease)    History reviewed. No pertinent past surgical history. Family History  Problem Relation Age of Onset  . Cancer Father     lung cancer   Social History  Substance Use Topics  . Smoking status: Never Smoker   . Smokeless tobacco: None  . Alcohol Use: 0.0 oz/week    0 Standard drinks or equivalent per week     Comment: socailly     Review of Systems  All other systems reviewed and are negative.     Allergies  Review of patient's allergies indicates no known allergies.  Home Medications   Prior to Admission medications   Medication Sig Start Date End Date Taking? Authorizing Provider  amoxicillin-clavulanate (AUGMENTIN) 875-125 MG tablet Take 1 tablet by mouth 2 (two) times daily. 10/11/15  Yes Shirline Freesory Nafziger, NP  esomeprazole (NEXIUM) 20 MG capsule Take 20 mg by mouth  daily at 12 noon.   Yes Historical Provider, MD  pseudoephedrine (SUDAFED) 30 MG tablet Take 30 mg by mouth every 4 (four) hours as needed for congestion.   Yes Historical Provider, MD  SUMAtriptan (IMITREX) 50 MG tablet Take 1 tablet (50 mg total) by mouth every 2 (two) hours as needed for migraine. May repeat in 2 hours if headache persists or recurs. 09/29/15  Yes Shirline Freesory Nafziger, NP  pantoprazole (PROTONIX) 40 MG tablet Take 1 tablet (40 mg total) by mouth 2 (two) times daily. 10/17/15   Cory Nafziger, NP   BP 160/93 mmHg  Pulse 97  Temp(Src) 97.7 F (36.5 C) (Oral)  Resp 16  SpO2 97% Physical Exam  Constitutional: He is oriented to person, place, and time. He appears well-developed and well-nourished.  HENT:  Head: Normocephalic and atraumatic.  Cardiovascular: Normal rate and regular rhythm.   No murmur heard. Pulmonary/Chest: Effort normal and breath sounds normal. No respiratory distress.  Abdominal: Soft. There is no tenderness. There is no rebound and no guarding.  Musculoskeletal: He exhibits no edema or tenderness.  Neurological: He is alert and oriented to person, place, and time.  Skin: Skin is warm and dry.  Psychiatric: He has a normal mood and affect. His behavior is normal.  Nursing note and vitals reviewed.   ED Course  Procedures (including critical care  time) Labs Review Labs Reviewed  COMPREHENSIVE METABOLIC PANEL - Abnormal; Notable for the following:    Glucose, Bld 101 (*)    All other components within normal limits  CBC WITH DIFFERENTIAL/PLATELET  URINALYSIS, ROUTINE W REFLEX MICROSCOPIC (NOT AT [facility])    Imaging Review US Abdomen Complete  10/18/2015  CLINICAL DATA:  Nausea and weight loss, chronic EXAM: ULTRASOUND ABDOMEN COMPLETE COMPARISON:  None. FINDINGS: Gallbladder: No gallstones or wall thickening visualized. There is no pericholecystic fluid. No sonographic Murphy sign noted. Common bile duct: Diameter: 4 mm. No intrahepatic, common hepatic,  or common bile duct dilatation. Liver: No focal lesion identified. Within normal limits in parenchymal echogenicity. IVC: No abnormality visualized. Pancreas: Visualized portion unremarkable. Much of the pancreas is obscured by gas. Spleen: Size and appearance within normal limits. Right Kidney: Length: 11.8 cm. Echogenicity within normal limits. No mass or hydronephrosis visualized. Left Kidney: Length: 11.4 cm. Echogenicity within normal limits. No mass or hydronephrosis visualized. Abdominal aorta: No aneurysm visualized. Other findings: No demonstrable ascites. IMPRESSION: Portions of pancreas obscured by gas. Visualized portions of pancreas appear within normal limits. Study otherwise unremarkable. Electronically Signed   By: Bretta Bang III M.D.   On: 10/18/2015 11:47   I have personally reviewed and evaluated these images and lab results as part of my medical decision-making.   EKG Interpretation None      MDM   Final diagnoses:  Nausea    Patient here for several months of nausea, no specific abdominal tenderness. No evidence of acute kidney or liver injury. No evidence of gallstones on ultrasound. Discussed with patient unclear cause of nausea and importance of outpatient follow-up. Providing prescription for Zofran. Discussed home care and return precautions.    Tilden Fossa, MD 10/18/15 1616

## 2015-10-18 NOTE — ED Notes (Signed)
md at bedside

## 2015-10-18 NOTE — ED Notes (Addendum)
Patient c/o constant Nausea for last 3 months and has been seeing PCP for this problem without any resolve.  Denies vomiting.   Patient states has had diarrhea x2 weeks.  Patient seen at PCP yesterday.  LBM today (loose).  Patient complains of epigastric pressure.  Pain on palpation and pain at rest.  Patient states pain 6/10.  Patient denies fever/chills.  Breathing even and unlabored, NAD at this time.

## 2015-10-18 NOTE — Telephone Encounter (Signed)
Try to contact pt but didn't answer

## 2015-10-18 NOTE — Discharge Instructions (Signed)
The cause of your nausea was not identified today.  Your labs and abdominal ultrasound were normal.  Follow up with your family doctor for further evaluation.     Nausea, Adult Nausea is the feeling that you have an upset stomach or have to vomit. Nausea by itself is not likely a serious concern, but it may be an early sign of more serious medical problems. As nausea gets worse, it can lead to vomiting. If vomiting develops, there is the risk of dehydration.  CAUSES   Viral infections.  Food poisoning.  Medicines.  Pregnancy.  Motion sickness.  Migraine headaches.  Emotional distress.  Severe pain from any source.  Alcohol intoxication. HOME CARE INSTRUCTIONS  Get plenty of rest.  Ask your caregiver about specific rehydration instructions.  Eat small amounts of food and sip liquids more often.  Take all medicines as told by your caregiver. SEEK MEDICAL CARE IF:  You have not improved after 2 days, or you get worse.  You have a headache. SEEK IMMEDIATE MEDICAL CARE IF:   You have a fever.  You faint.  You keep vomiting or have blood in your vomit.  You are extremely weak or dehydrated.  You have dark or bloody stools.  You have severe chest or abdominal pain. MAKE SURE YOU:  Understand these instructions.  Will watch your condition.  Will get help right away if you are not doing well or get worse.   This information is not intended to replace advice given to you by your health care provider. Make sure you discuss any questions you have with your health care provider.   Document Released: 12/12/2004 Document Revised: 11/25/2014 Document Reviewed: 07/17/2011 Elsevier Interactive Patient Education [employer]2016 [employer]

## 2015-10-23 ENCOUNTER — Other Ambulatory Visit: Payer: Self-pay | Admitting: Adult Health

## 2015-10-23 MED ORDER — ONDANSETRON HCL 4 MG PO TABS: 4.0000 mg | ORAL_TABLET | Freq: Three times a day (TID) | ORAL | Status: DC | PRN

## 2015-11-03 ENCOUNTER — Encounter: Payer: Self-pay | Admitting: Neurology

## 2015-11-03 ENCOUNTER — Ambulatory Visit (INDEPENDENT_AMBULATORY_CARE_PROVIDER_SITE_OTHER): Payer: BC Managed Care – PPO | Admitting: Neurology

## 2015-11-03 VITALS — BP 134/80 | HR 78 | Ht 73.0 in | Wt 223.0 lb

## 2015-11-03 DIAGNOSIS — G43009 Migraine without aura, not intractable, without status migrainosus: Secondary | ICD-10-CM

## 2015-11-03 MED ORDER — PREDNISONE 10 MG PO TABS: ORAL_TABLET | ORAL | Status: DC

## 2015-11-03 NOTE — Patient Instructions (Signed)
Migraine Recommendations: 1.  To help break the daily headache, take prednisone taper.  Take 6tabs x1day, then 5tabs x1day, then 4tabs x1day, then 3tabs x1day, then 2tabs x1day, then 1tab x1day, then STOP Call in one to two weeks with update.  If headaches are still persistent, we can start a daily medication 2.  Take sumatriptan 50mg  at earliest onset of headache.  May repeat dose once in 2 hours if needed.  Do not exceed two tablets in 24 hours.  If ineffective, next time try take two tablets (100mg ) at earliest onset and you can repeat in 2 hours if needed. 3.  Limit use of pain relievers to no more than 2 days out of the week.  These medications include acetaminophen, ibuprofen, triptans and narcotics.  This will help reduce risk of rebound headaches.  STOP TYLENOL SINUS 4.  Be aware of common food triggers such as processed sweets, processed foods with nitrites (such as deli meat, hot dogs, sausages), foods with MSG, alcohol (such as wine), chocolate, certain cheeses, certain fruits (dried fruits, some citrus fruit), vinegar, diet soda. 4.  Avoid caffeine 5.  Routine exercise 6.  Proper sleep hygiene 7.  Stay adequately hydrated with water 8.  Keep a headache diary. 9.  Maintain proper stress management. 10.  Do not skip meals. 11.  Consider supplements:  Magnesium oxide 400mg  to 600mg  daily, riboflavin 400mg , Coenzyme Q 10 100mg  three times daily 12.  Follow up in 3 months

## 2015-11-03 NOTE — Progress Notes (Signed)
NEUROLOGY CONSULTATION NOTE  Riley BerlinJoshua Choe Katich MRN: [id number] DOB: 14-Aug-1991  Referring provider: Shirline Freesory Nafziger, NP Primary care provider: Shirline Freesory Nafziger, NP  Reason for consult:  migraine  HISTORY OF PRESENT ILLNESS: Riley Davis is a 24 year old left-handed male who presents for migraine.  History obtained by patient and PCP note.  Onset:  Headaches since highschool, but have become more intense since summer. Location:  Right temporal/frontal.   Quality:  Non-throbbing/pressure Intensity:  7-8/10 Aura:  no Prodrome:  no Associated symptoms:  Nausea, photophobia, phonophobia, osmophobia.  No vomiting or visual disturbance Duration:  Half a day (starts late afternoon and gone when wakes up in morning Frequency:  Once a month Triggers/exacerbating factors:  heat Relieving factors:  Laying down Activity:  Able to function  Past abortive medication:  Sumatriptan 50mg  (tried a couple of times but not at earliest onset of headache) Past preventative medication:  none Other past therapy:  non  Current abortive medication:  Excedrin (usually helps), Tylenol Sinus, Zofran 4mg  Antihypertensive medications:  no Antidepressant medications:  no Anticonvulsant medications:  no Vitamins/Herbal/Supplements:  no Other therapy:  no Over the past 1.5 weeks, has had a constant bi-temporal non-throbbing headache.  Started taking Tylenol Sinus daily.  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 Migraines   . Chicken pox   . Frequent headaches   . GERD (gastroesophageal reflux disease)     PAST SURGICAL HISTORY: No past surgical history on file.  MEDICATIONS: Current Outpatient Prescriptions on File Prior to Visit  Medication Sig Dispense Refill  . ondansetron  (ZOFRAN) 4 MG tablet Take 1 tablet (4 mg total) by mouth every 8 (eight) hours as needed for nausea or vomiting. 20 tablet 3  . pantoprazole (PROTONIX) 40 MG tablet Take 1 tablet (40 mg total) by mouth 2 (two) times daily. 60 tablet 6  . pseudoephedrine (SUDAFED) 30 MG tablet Take 30 mg by mouth every 4 (four) hours as needed for congestion.    . SUMAtriptan (IMITREX) 50 MG tablet Take 1 tablet (50 mg total) by mouth every 2 (two) hours as needed for migraine. May repeat in 2 hours if headache persists or recurs. 10 tablet 3   No current facility-administered medications on file prior to visit.    ALLERGIES: No Known Allergies  FAMILY HISTORY: Family History  Problem Relation Age of Onset  . Cancer Father     lung cancer    SOCIAL HISTORY: Social History   Social History  . Marital Status: Single    Spouse Name: N/A  . Number of Children: N/A  . Years of Education: N/A   Occupational History  . Not on file.   Social History Main Topics  . Smoking status: Never Smoker   . Smokeless tobacco: Not on file  . Alcohol Use: 0.0 oz/week    0 Standard drinks or equivalent per week     Comment: socailly   . Drug Use: No  . Sexual Activity: Not on file   Other Topics Concern  . 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    Not married       REVIEW OF SYSTEMS: Constitutional: No fevers, chills, or sweats, no generalized fatigue, change in appetite Eyes: No  visual changes, double vision, eye pain Ear, nose and throat: No hearing loss, ear pain, nasal congestion, sore throat Cardiovascular: No chest pain, palpitations Respiratory:  No shortness of breath at rest or with exertion, wheezes GastrointestinaI: No nausea, vomiting, diarrhea, abdominal pain, fecal incontinence Genitourinary:  No dysuria, urinary retention or frequency Musculoskeletal:  No neck pain, back pain Integumentary: No rash, pruritus, skin lesions Neurological: as  above Psychiatric: No depression, insomnia, anxiety Endocrine: No palpitations, fatigue, diaphoresis, mood swings, change in appetite, change in weight, increased thirst Hematologic/Lymphatic:  No anemia, purpura, petechiae. Allergic/Immunologic: no itchy/runny eyes, nasal congestion, recent allergic reactions, rashes  PHYSICAL EXAM: Filed Vitals:   11/03/15 1017  BP: 134/80  Pulse: 78   General: No acute distress.  Head:  Normocephalic/atraumatic Eyes:  fundi unremarkable, without vessel changes, exudates, hemorrhages or papilledema. Neck: supple, no paraspinal tenderness, full range of motion Back: No paraspinal tenderness Heart: regular rate and rhythm Lungs: Clear to auscultation bilaterally. Vascular: No carotid bruits. Neurological Exam: Mental status: alert and oriented to person, place, and time, recent and remote memory intact, fund of knowledge intact, attention and concentration intact, speech fluent and not dysarthric, language intact. Cranial nerves: CN I: not tested CN II: pupils equal, round and reactive to light, visual fields intact, fundi unremarkable, without vessel changes, exudates, hemorrhages or papilledema. CN III, IV, VI:  full range of motion, no nystagmus, no ptosis CN V: facial sensation intact CN VII: upper and lower face symmetric CN VIII: hearing intact CN IX, X: gag intact, uvula midline CN XI: sternocleidomastoid and trapezius muscles intact CN XII: tongue midline Bulk & Tone: normal, no fasciculations. Motor:  5/5 throughout  Sensation: temperature and vibration sensation intact. Deep Tendon Reflexes:  2+ throughout, toes downgoing.  Finger to nose testing:  Without dysmetria.  Heel to shin:  Without dysmetria.  Gait:  Normal station and stride.  Able to turn and tandem walk. Romberg negative.  IMPRESSION: Migraine without aura Persistent headache over past 1.5 weeks (possibly tension-type)  PLAN: 1.  Prednisone taper to try and break  current daily headache.  He will call in 1 to 2 weeks.  If headache persists, will start preventative. 2.  Try sumatriptan  or  at earliest onset of headache and may repeat once in 2 hours if needed. 3.  Lifestyle modification and supplements (magnesium, riboflavin, coenzyme q10) 4.  Follow up in 3 months.  45 minutes spent face to face with patient, over 50% spent discussing diagnosis and management.  Thank you for allowing me to take part in the care of this patient.  Shon Millet, DO  CC:  Angela Hayslee Casebolt, NP

## 2015-11-15 ENCOUNTER — Telehealth: Payer: Self-pay | Admitting: Neurology

## 2015-11-15 MED ORDER — NORTRIPTYLINE HCL 10 MG PO CAPS: 10.0000 mg | ORAL_CAPSULE | Freq: Every day | ORAL | Status: DC

## 2015-11-15 NOTE — Telephone Encounter (Signed)
Pt is still having pressure and wants to talk to someone please call [phone]

## 2015-11-15 NOTE — Telephone Encounter (Signed)
Spoke with pt. He states that he has finished his prednisone taper. While on taper all symptoms subsided.  However, starting on Christmas Eve, pt has began to experience pressure again. It started as pressure on his L side near ear, eventually moved to his right ear, and now is located above his right eye. Please advise.

## 2015-11-15 NOTE — Telephone Encounter (Signed)
Message relayed to patient. Verbalized understanding and denied questions.   

## 2015-11-15 NOTE — Telephone Encounter (Signed)
I would start a preventative medication.  We will start with nortriptyline 10mg  at bedtime.  It is an antidepressant but also used to treat and prevent headaches.  Side effects may include sleepiness, dizziness or dry mouth, so he should drink plenty of water as well.  He should call in 4 weeks with an update and we can increase dose at that time, if needed.  He may not necessarily see an improvement right away, but he shouldn't be discouraged. We must give each dose 4 weeks before deciding whether we need to increase the dose.

## 2016-01-17 ENCOUNTER — Other Ambulatory Visit: Payer: Self-pay | Admitting: Neurology

## 2016-01-18 NOTE — Telephone Encounter (Signed)
Last OV: 11/03/15 Next OV: 03/06/16

## 2016-02-08 ENCOUNTER — Encounter: Payer: Self-pay | Admitting: Adult Health

## 2016-02-20 ENCOUNTER — Telehealth: Payer: Self-pay | Admitting: Neurology

## 2016-02-20 MED ORDER — NORTRIPTYLINE HCL 10 MG PO CAPS: 10.0000 mg | ORAL_CAPSULE | Freq: Every day | ORAL | Status: DC

## 2016-02-20 NOTE — Telephone Encounter (Signed)
Riley FeilJoshua Oblinger 04/09/1991 would like a refill on his medication please. His number is [phone]. Sorry he didn't tell me the name of the medication.

## 2016-02-28 ENCOUNTER — Encounter: Payer: Self-pay | Admitting: Neurology

## 2016-02-29 IMAGING — US US ABDOMEN COMPLETE
1 series · 14 of 25 positions shown · non-contrast
Comparison: None.

CLINICAL DATA: Nausea and weight loss, chronic

EXAM:
ULTRASOUND ABDOMEN COMPLETE

[Series 1: us abdomen complete · 0.18mm/px · 14 of 97 slices shown]
[im 1/97]
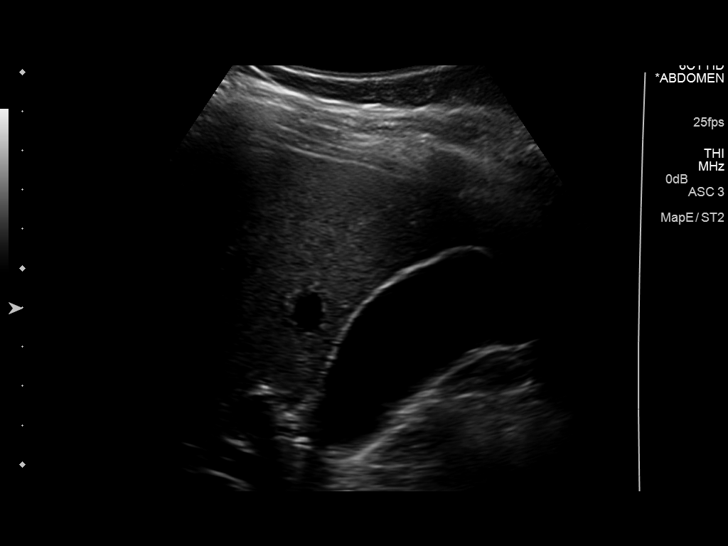
[im 9/97]
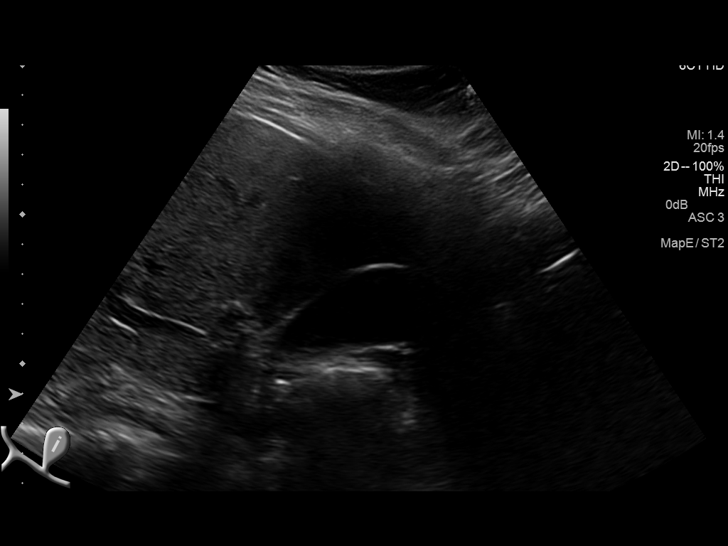
[im 17/97]
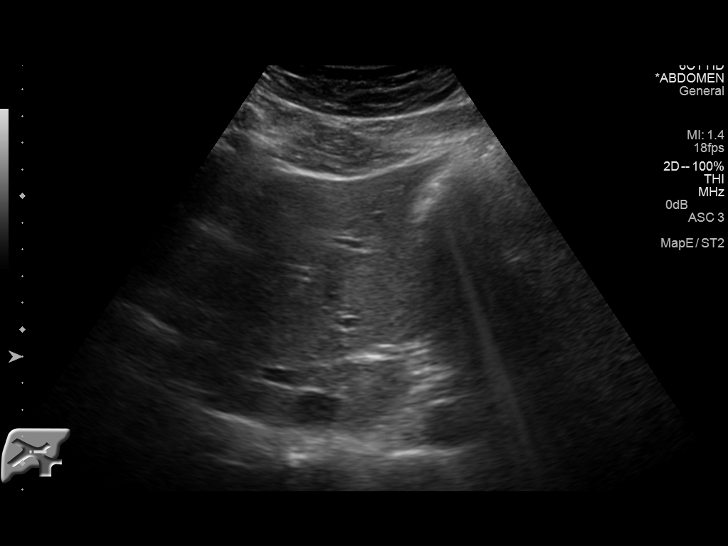
[im 25/97]
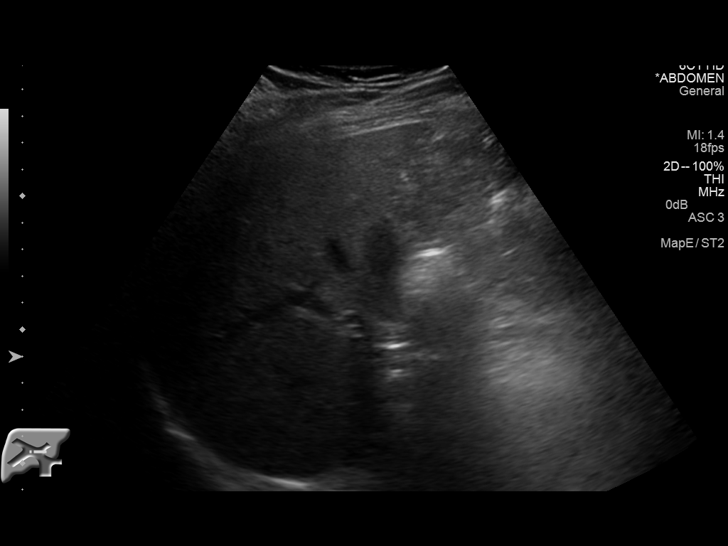
[im 33/97]
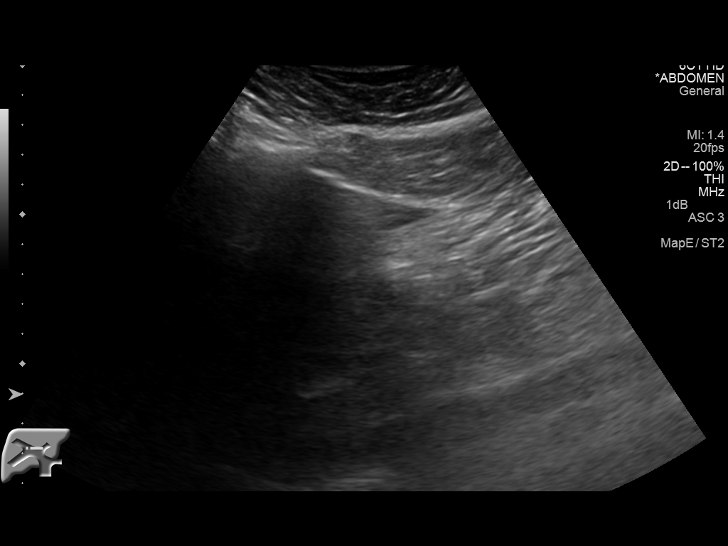
[im 37/97]
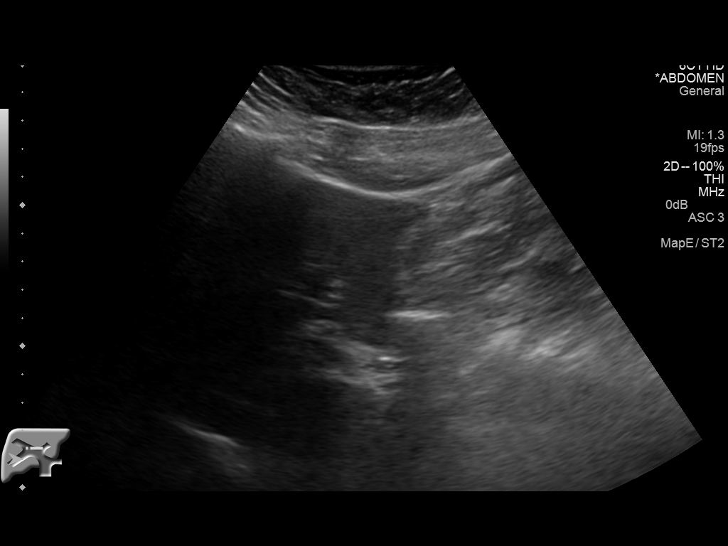
[im 45/97]
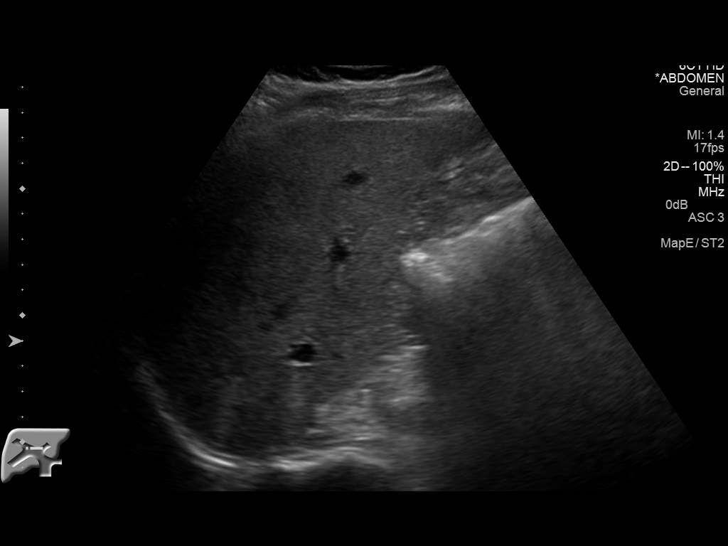
[im 53/97]
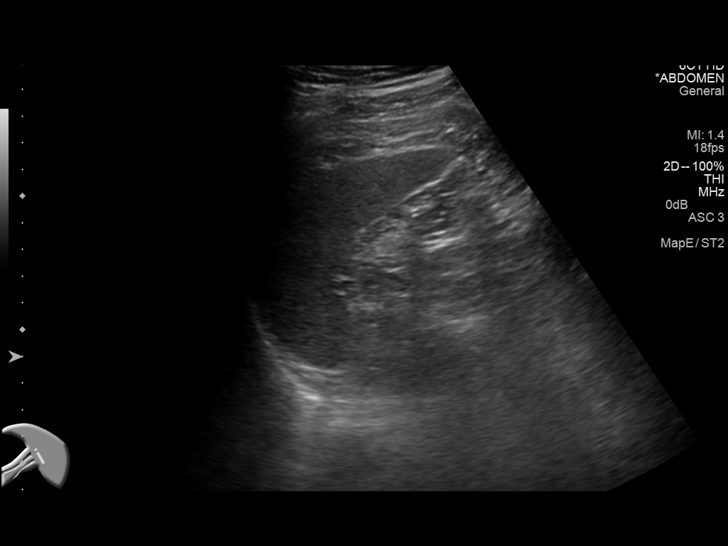
[im 61/97]
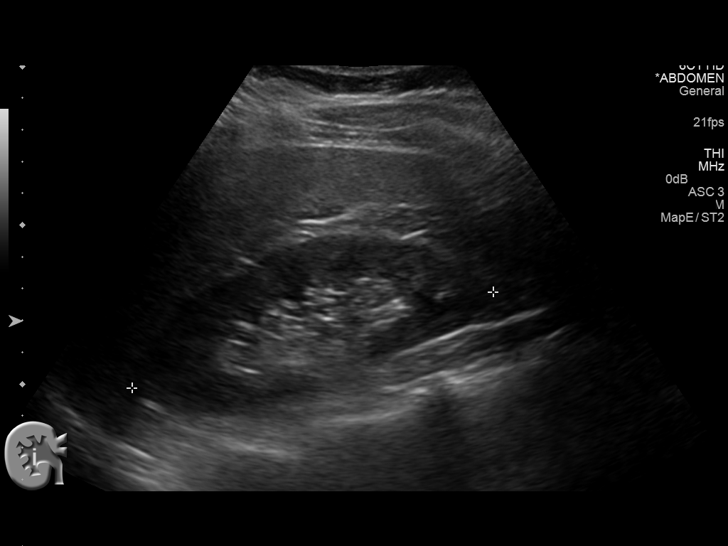
[im 65/97]
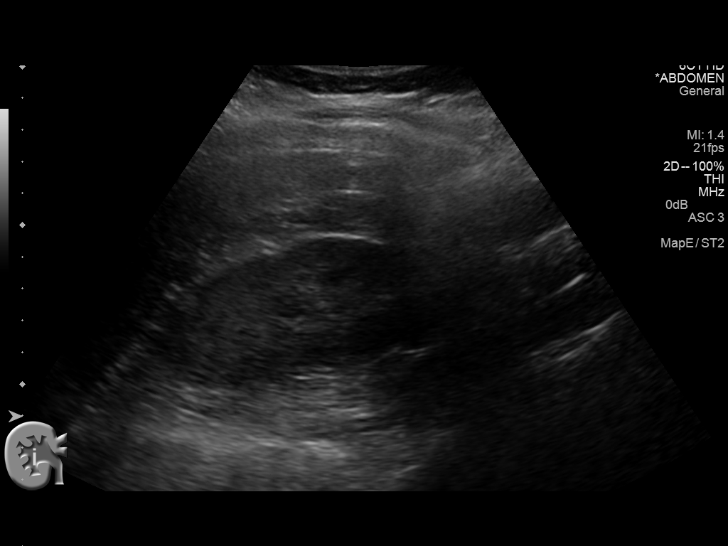
[im 73/97]
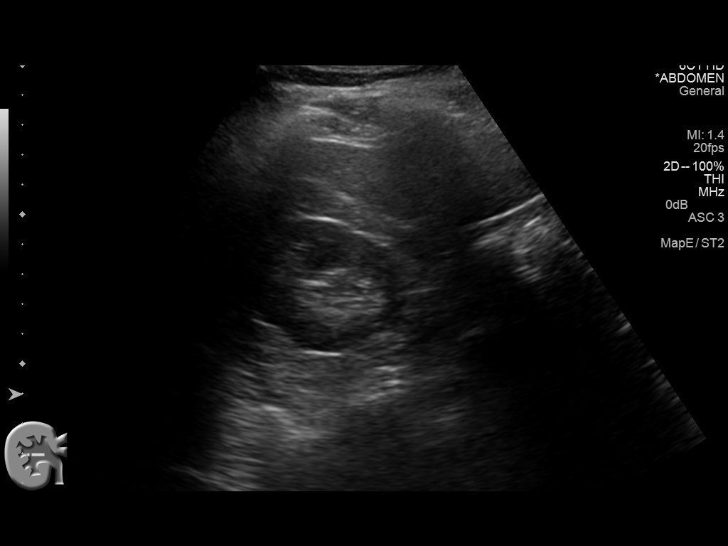
[im 81/97]
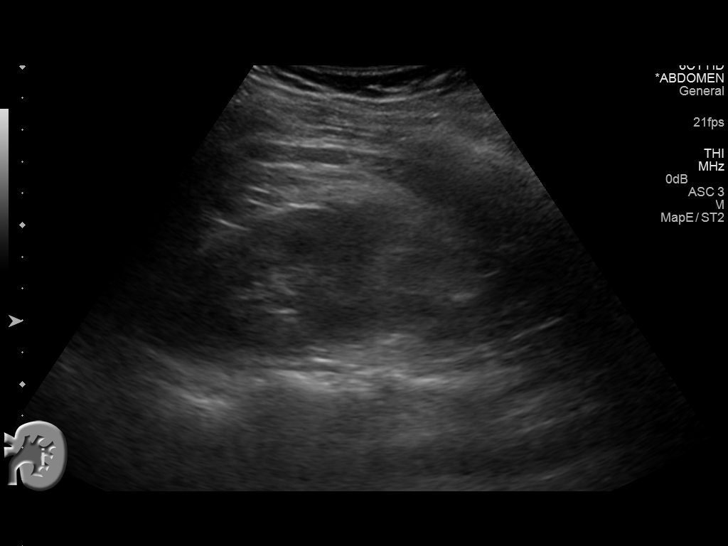
[im 89/97]
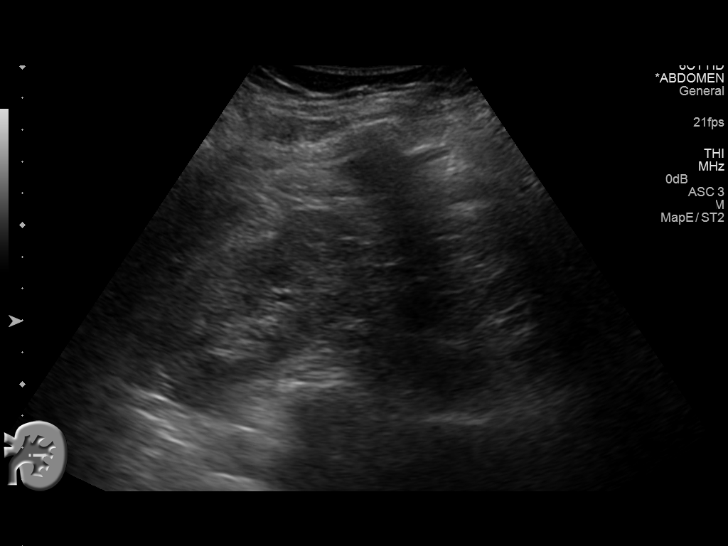
[im 97/97]
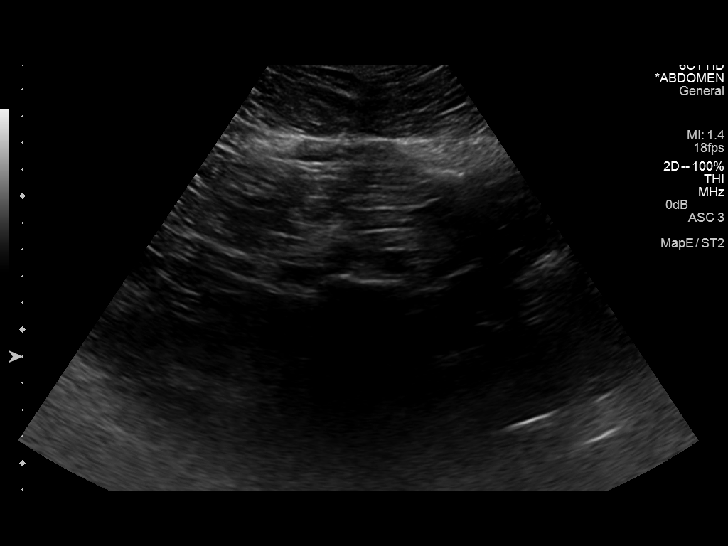

[14 of 25 positions shown; findings below may reference images not displayed]

FINDINGS: Gallbladder: No gallstones or wall thickening visualized. There is
no pericholecystic fluid. No sonographic Murphy sign noted.

Common bile duct: Diameter: 4 mm. No intrahepatic, common hepatic,
or common bile duct dilatation.

Liver: No focal lesion identified. Within normal limits in
parenchymal echogenicity.

IVC: No abnormality visualized.

Pancreas: Visualized portion unremarkable. Much of the pancreas is
obscured by gas.

Spleen: Size and appearance within normal limits.

Right Kidney: Length: 11.8 cm. Echogenicity within normal limits. No
mass or hydronephrosis visualized.

Left Kidney: Length: 11.4 cm. Echogenicity within normal limits. No
mass or hydronephrosis visualized.

Abdominal aorta: No aneurysm visualized.

Other findings: No demonstrable ascites.
IMPRESSION: Portions of pancreas obscured by gas. Visualized portions of
pancreas appear within normal limits. Study otherwise unremarkable.

## 2016-02-29 NOTE — Telephone Encounter (Signed)
Please see below.

## 2016-03-06 ENCOUNTER — Ambulatory Visit (INDEPENDENT_AMBULATORY_CARE_PROVIDER_SITE_OTHER): Payer: BC Managed Care – PPO | Admitting: Neurology

## 2016-03-06 ENCOUNTER — Encounter: Payer: Self-pay | Admitting: Neurology

## 2016-03-06 VITALS — BP 112/64 | HR 94 | Ht 73.0 in | Wt 222.0 lb

## 2016-03-06 DIAGNOSIS — G43009 Migraine without aura, not intractable, without status migrainosus: Secondary | ICD-10-CM

## 2016-03-06 NOTE — Patient Instructions (Signed)
1.  Continue nortriptyline 10mg  at bedtime 2.  Use Excedrin Migraine as needed 3.  Follow up in 4 to 5 months.

## 2016-03-06 NOTE — Progress Notes (Signed)
NEUROLOGY FOLLOW UP OFFICE NOTE  Clayburn Weekly Dolores [id number]  HISTORY OF PRESENT ILLNESS: Riley Davis is a 25 year old left-handed male who follows up for migraine.    UPDATE: He takes nortriptyline  at bedtime.  He no longer has spontaneous migraines.  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  It resolves within 30 to 45 minutes after taking 1 Excedrin.  He hasn't really been using sumatriptan.  HISTORY: Onset:  Headaches since highschool, but have become more intense since summer. Location:  Right temporal/frontal.   Quality:  Non-throbbing/pressure Intensity:  7-8/10 Aura:  no Prodrome:  no Associated symptoms:  Nausea, photophobia, phonophobia, osmophobia.  No vomiting or visual disturbance Duration:  Half a day (starts late afternoon and gone when wakes up in morning Frequency:  Once a month Triggers/exacerbating factors:  heat Relieving factors:  Laying down Activity:  Able to function  Past abortive medication:  Sumatriptan  (tried a couple of times but not at earliest onset of headache) Past preventative medication:  none Other past therapy:  non  Current abortive medication:  Excedrin (usually helps), Tylenol Sinus, Zofran  Antihypertensive medications:  no Antidepressant medications:  no Anticonvulsant medications:  no Vitamins/Herbal/Supplements:  no Other therapy:  no Over the past 1.5 weeks, has had a constant bi-temporal non-throbbing headache.  Started taking Tylenol Sinus daily.  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 Migraines   . Chicken pox   . Frequent headaches   . GERD (gastroesophageal reflux disease)     MEDICATIONS: Current Outpatient Prescriptions on File Prior to Visit    Medication Sig Dispense Refill  . pseudoephedrine (SUDAFED) 30 MG tablet Take 30 mg by mouth every 4 (four) hours as needed for congestion.     No current facility-administered medications on file prior to visit.    ALLERGIES: No Known Allergies  FAMILY HISTORY: Family History  Problem Relation Age of Onset  . Cancer Father     lung cancer    SOCIAL HISTORY: Social History   Social History  . Marital Status: Single    Spouse Name: N/A  . Number of Children: N/A  . Years of Education: N/A   Occupational History  . Not on file.   Social History Main Topics  . Smoking status: Never Smoker   . Smokeless tobacco: Not on file  . Alcohol Use: 0.0 oz/week    0 Standard drinks or equivalent per week     Comment: socailly   . Drug Use: No  . Sexual Activity: Not on file   Other Topics Concern  . 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    Not married       REVIEW OF SYSTEMS: Constitutional: No fevers, chills, or sweats, no generalized fatigue, change in appetite Eyes: No visual changes, double vision, eye pain Ear, nose and throat: No hearing loss, ear pain, nasal congestion, sore throat Cardiovascular: No chest pain, palpitations Respiratory:  No shortness of breath at rest or with exertion, wheezes GastrointestinaI: No nausea, vomiting, diarrhea, abdominal pain, fecal incontinence Genitourinary:  No dysuria, urinary retention or frequency Musculoskeletal:  No neck pain, back pain Integumentary: No rash, pruritus, skin lesions Neurological: as  above Psychiatric: No depression, insomnia, anxiety Endocrine: No palpitations, fatigue, diaphoresis, mood swings, change in appetite, change in weight, increased thirst Hematologic/Lymphatic:  No anemia, purpura, petechiae. Allergic/Immunologic: no itchy/runny eyes, nasal congestion, recent allergic reactions, rashes  PHYSICAL EXAM: Filed Vitals:   03/06/16 1532  BP: 112/64  Pulse: 94    General: No acute distress.  Patient appears well-groomed.   Head:  Normocephalic/atraumatic Eyes:  Fundi examined but not visualized Neck: supple, no paraspinal tenderness, full range of motion Heart:  Regular rate and rhythm Lungs:  Clear to auscultation bilaterally Back: No paraspinal tenderness Neurological Exam: alert and oriented to person, place, and time. Attention span and concentration intact, recent and remote memory intact, fund of knowledge intact.  Speech fluent and not dysarthric, language intact.  CN II-XII intact. Bulk and tone normal, muscle strength 5/5 throughout.  Sensation to light touch ntact.  Deep tendon reflexes 2+ throughout.  Finger to nose and heel to shin testing intact.  Gait normal, Romberg negative.  IMPRESSION: Migraine without aura, stable  PLAN: Nortriptyline 10mg  at bedtime Excedrin as needed Follow up in 4 to 5 months  15 minutes spent face to face with patient, over 50% spent discussing management.  Shon MilletAdam Jaffe, DO  CC:  Shirline Freesory Nafziger, NP

## 2016-03-28 ENCOUNTER — Telehealth: Payer: Self-pay

## 2016-03-28 MED ORDER — NORTRIPTYLINE HCL 10 MG PO CAPS: 10.0000 mg | ORAL_CAPSULE | Freq: Every day | ORAL | Status: DC

## 2016-03-28 NOTE — Telephone Encounter (Signed)
Pt called for refill. Requested 90 day supply. REfill sent in.

## 2016-04-15 ENCOUNTER — Encounter: Payer: Self-pay | Admitting: Adult Health

## 2016-04-16 ENCOUNTER — Encounter: Payer: Self-pay | Admitting: Adult Health

## 2016-07-05 ENCOUNTER — Telehealth: Payer: Self-pay | Admitting: Neurology

## 2016-07-05 ENCOUNTER — Ambulatory Visit: Payer: BC Managed Care – PPO | Admitting: Neurology

## 2016-07-05 NOTE — Telephone Encounter (Signed)
Riley Davis 05/07/1991. He needs a 3 month refill for Nortriptyline. His # is [phone]. I asked if he contacted his pharmacy first. He said he was unsure if the pharmacy would know. Thank you

## 2016-07-08 MED ORDER — NORTRIPTYLINE HCL 10 MG PO CAPS: 10.0000 mg | ORAL_CAPSULE | Freq: Every day | ORAL | 0 refills | Status: DC

## 2016-07-08 NOTE — Telephone Encounter (Signed)
RX sent. Pt has f/U next month.

## 2016-08-02 ENCOUNTER — Ambulatory Visit (INDEPENDENT_AMBULATORY_CARE_PROVIDER_SITE_OTHER): Payer: BC Managed Care – PPO | Admitting: Neurology

## 2016-08-02 ENCOUNTER — Encounter: Payer: Self-pay | Admitting: Neurology

## 2016-08-02 VITALS — BP 122/80 | HR 92 | Ht 73.0 in | Wt 228.0 lb

## 2016-08-02 DIAGNOSIS — G44219 Episodic tension-type headache, not intractable: Secondary | ICD-10-CM

## 2016-08-02 DIAGNOSIS — G43009 Migraine without aura, not intractable, without status migrainosus: Secondary | ICD-10-CM

## 2016-08-02 NOTE — Progress Notes (Signed)
NEUROLOGY FOLLOW UP OFFICE NOTE  Riley GoryJoshua Davis Facundo [id number]  HISTORY OF PRESENT ILLNESS: Riley BerlinJoshua Davis Riley Davis is a 25 year old left-handed male who follows up for migraine.     UPDATE: He takes nortriptyline 10mg  at bedtime.  He no longer has spontaneous migraines.  He had 2 tension type headaches after school.  It resolves within 30 to 45 minutes after taking 1 Excedrin.  He hasn't really been using sumatriptan.   HISTORY: Onset:  Headaches since highschool, but have become more intense since summer. Location:  Right temporal/frontal.   Quality:  Non-throbbing/pressure Intensity:  7-8/10 Aura:  no Prodrome:  no Associated symptoms:  Nausea, photophobia, phonophobia, osmophobia.  No vomiting or visual disturbance Duration:  Half a day (starts late afternoon and gone when wakes up in morning Frequency:  Once a month Triggers/exacerbating factors:  heat Relieving factors:  Laying down Activity:  Able to function   Past abortive medication:  Sumatriptan 50mg  (tried a couple of times but not at earliest onset of headache) Past preventative medication:  none Other past therapy:  non   Current abortive medication:  Excedrin (usually helps), Tylenol Sinus, Zofran 4mg  Antihypertensive medications:  no Antidepressant medications:  no Anticonvulsant medications:  no Vitamins/Herbal/Supplements:  no Other therapy:  no Over the past 1.5 weeks, has had a constant bi-temporal non-throbbing headache.  Started taking Tylenol Sinus daily.   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 Chicken pox   . Frequent headaches   . GERD (gastroesophageal reflux disease)   . Migraines     MEDICATIONS: Current Outpatient Prescriptions on File Prior to Visit  Medication Sig Dispense  Refill  . aspirin-acetaminophen-caffeine (EXCEDRIN MIGRAINE) 250-250-65 MG tablet Take by mouth every 6 (six) hours as needed for headache.    . nortriptyline (PAMELOR) 10 MG capsule Take 1 capsule (10 mg total) by mouth at bedtime. 90 capsule 0  . pseudoephedrine (SUDAFED) 30 MG tablet Take 30 mg by mouth every 4 (four) hours as needed for congestion.     No current facility-administered medications on file prior to visit.     ALLERGIES: No Known Allergies  FAMILY HISTORY: Family History  Problem Relation Age of Onset  . Cancer Father     lung cancer    SOCIAL HISTORY: Social History   Social History  . Marital status: Single    Spouse name: N/A  . Number of children: N/A  . Years of education: N/A   Occupational History  . Not on file.   Social History Main Topics  . Smoking status: Never Smoker  . Smokeless tobacco: Not on file  . Alcohol use 0.0 oz/week     Comment: socailly   . Drug use: No  . Sexual activity: Not on file   Other Topics Concern  . 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    Not married       REVIEW OF SYSTEMS: Constitutional: No fevers, chills, or sweats, no generalized fatigue, change in appetite Eyes: No visual changes, double vision, eye pain Ear, nose and throat: No hearing loss, ear pain, nasal congestion, sore throat Cardiovascular: No chest pain, palpitations Respiratory:  No shortness of breath at rest or with exertion, wheezes GastrointestinaI: No nausea, vomiting,  diarrhea, abdominal pain, fecal incontinence Genitourinary:  No dysuria, urinary retention or frequency Musculoskeletal:  No neck pain, back pain Integumentary: No rash, pruritus, skin lesions Neurological: as above Psychiatric: No depression, insomnia, anxiety Endocrine: No palpitations, fatigue, diaphoresis, mood swings, change in appetite, change in weight, increased thirst Hematologic/Lymphatic:  No purpura,  petechiae. Allergic/Immunologic: no itchy/runny eyes, nasal congestion, recent allergic reactions, rashes  PHYSICAL EXAM: Vitals:   08/02/16 1425  BP: 122/80  Pulse: 92   General: No acute distress.  Patient appears well-groomed.   Head:  Normocephalic/atraumatic Eyes:  Fundi examined but not visualized Neck: supple, no paraspinal tenderness, full range of motion Heart:  Regular rate and rhythm Lungs:  Clear to auscultation bilaterally Back: No paraspinal tenderness Neurological Exam: alert and oriented to person, place, and time. Attention span and concentration intact, recent and remote memory intact, fund of knowledge intact.  Speech fluent and not dysarthric, language intact.  CN II-XII intact. Bulk and tone normal, muscle strength 5/5 throughout.  Sensation to light touch  intact.  Deep tendon reflexes 2+ throughout.  Finger to nose testing intact.  Gait normal.  IMPRESSION: Tension-type headache Migraine  PLAN: 1.  Nortriptyline 10mg  at bedtime 2.  Excedrin Migraine as needed 3.  Follow up in 9 months.  15 minutes spent face to face with patient, over 50% spent counseling.  Shon Millet, DO  CC:  Shirline Frees, NP

## 2016-08-02 NOTE — Patient Instructions (Signed)
Tension Headache A tension headache is pain, pressure, or aching that is felt over the front and sides of your head. These headaches can last from 30 minutes to several days. HOME CARE Managing Pain  Take over-the-counter and prescription medicines only as told by your doctor.  Lie down in a dark, quiet room when you have a headache.  If directed, apply ice to your head and neck area:  Put ice in a plastic bag.  Place a towel between your skin and the bag.  Leave the ice on for 20 minutes, 2-3 times per day.  Use a heating pad or a hot shower to apply heat to your head and neck area as told by your doctor. Eating and Drinking  Eat meals on a regular schedule.  Do not drink a lot of alcohol.  Do not use a lot of caffeine, or stop using caffeine. General Instructions  Keep all follow-up visits as told by your doctor. This is important.  Keep a journal to find out if certain things bring on headaches. For example, write down:  What you eat and drink.  How much sleep you get.  Any change to your diet or medicines.  Try getting a massage, or doing other things that help you to relax.  Lessen stress.  Sit up straight. Do not tighten (tense) your muscles.  Do not use tobacco products. This includes cigarettes, chewing tobacco, or e-cigarettes. If you need help quitting, ask your doctor.  Exercise regularly as told by your doctor.  Get enough sleep. This may mean 7-9 hours of sleep. GET HELP IF:  Your symptoms are not helped by medicine.  You have a headache that feels different from your usual headache.  You feel sick to your stomach (nauseous) or you throw up (vomit).  You have a fever. GET HELP RIGHT AWAY IF:  Your headache becomes very bad.  You keep throwing up.  You have a stiff neck.  You have trouble seeing.  You have trouble speaking.  You have pain in your eye or ear.  Your muscles are weak or you lose muscle control.  You lose your balance  or you have trouble walking.  You feel like you will pass out (faint) or you pass out.  You have confusion.   This information is not intended to replace advice given to you by your health care provider. Make sure you discuss any questions you have with your health care provider.   Document Released: 01/29/2010 Document Revised: 07/26/2015 Document Reviewed: 02/27/2015 Elsevier Interactive Patient Education 2016 [employer]  

## 2016-10-07 ENCOUNTER — Encounter: Payer: Self-pay | Admitting: Adult Health

## 2016-10-07 ENCOUNTER — Telehealth: Payer: Self-pay | Admitting: Neurology

## 2016-10-07 MED ORDER — NORTRIPTYLINE HCL 10 MG PO CAPS: 10.0000 mg | ORAL_CAPSULE | Freq: Every day | ORAL | 1 refills | Status: DC

## 2016-10-07 NOTE — Telephone Encounter (Signed)
Please call pt and schedule an appointment

## 2016-10-07 NOTE — Telephone Encounter (Signed)
RX sent to pharmacy  

## 2016-10-07 NOTE — Telephone Encounter (Signed)
Riley GoryJoshua Choe Cislo Davis # [phone]. He said he needed a Medication refill on Nortriptyline. He uses [facility] [address]. I did ask if he contacted the pharmacy first. Thank you

## 2016-10-08 ENCOUNTER — Ambulatory Visit: Payer: BC Managed Care – PPO | Admitting: Adult Health

## 2016-10-08 DIAGNOSIS — Z0289 Encounter for other administrative examinations: Secondary | ICD-10-CM

## 2017-02-18 ENCOUNTER — Encounter (HOSPITAL_COMMUNITY): Payer: Self-pay | Admitting: Nurse Practitioner

## 2017-02-18 ENCOUNTER — Emergency Department (HOSPITAL_COMMUNITY)
Admission: EM | Admit: 2017-02-18 | Discharge: 2017-02-18 | Disposition: A | Discharge status: Home / Self Care | Payer: BC Managed Care – PPO | Attending: Emergency Medicine | Admitting: Emergency Medicine

## 2017-02-18 DIAGNOSIS — Z79899 Other long term (current) drug therapy: Secondary | ICD-10-CM | POA: Diagnosis not present

## 2017-02-18 DIAGNOSIS — R110 Nausea: Secondary | ICD-10-CM

## 2017-02-18 DIAGNOSIS — Z7982 Long term (current) use of aspirin: Secondary | ICD-10-CM | POA: Insufficient documentation

## 2017-02-18 LAB — CBC WITH DIFFERENTIAL/PLATELET
Basophils Absolute: 0 10*3/uL (ref 0.0–0.1)
Basophils Relative: 0 %
EOS ABS: 0.3 10*3/uL (ref 0.0–0.7)
Eosinophils Relative: 3 %
HCT: 44.9 % (ref 39.0–52.0)
HEMOGLOBIN: 15.1 g/dL (ref 13.0–17.0)
LYMPHS ABS: 1.6 10*3/uL (ref 0.7–4.0)
Lymphocytes Relative: 16 %
MCH: 30 pg (ref 26.0–34.0)
MCHC: 33.6 g/dL (ref 30.0–36.0)
MCV: 89.1 fL (ref 78.0–100.0)
MONOS PCT: 5 %
Monocytes Absolute: 0.5 10*3/uL (ref 0.1–1.0)
Neutro Abs: 7.3 10*3/uL (ref 1.7–7.7)
Neutrophils Relative %: 76 %
PLATELETS: 353 10*3/uL (ref 150–400)
RBC: 5.04 MIL/uL (ref 4.22–5.81)
RDW: 13.1 % (ref 11.5–15.5)
WBC: 9.7 10*3/uL (ref 4.0–10.5)

## 2017-02-18 LAB — COMPREHENSIVE METABOLIC PANEL
ALBUMIN: 3.8 g/dL (ref 3.5–5.0)
ALT: 34 U/L (ref 17–63)
AST: 21 U/L (ref 15–41)
Alkaline Phosphatase: 64 U/L (ref 38–126)
Anion gap: 8 (ref 5–15)
BUN: 9 mg/dL (ref 6–20)
CHLORIDE: 102 mmol/L (ref 101–111)
CO2: 27 mmol/L (ref 22–32)
CREATININE: 0.97 mg/dL (ref 0.61–1.24)
Calcium: 9.4 mg/dL (ref 8.9–10.3)
GFR calc Af Amer: >60 mL/min (ref >60)
GFR calc non Af Amer: >60 mL/min (ref >60)
Glucose, Bld: 100 mg/dL — ABNORMAL HIGH (ref 65–99)
Potassium: 4 mmol/L (ref 3.5–5.1)
SODIUM: 137 mmol/L (ref 135–145)
Total Bilirubin: 0.8 mg/dL (ref 0.3–1.2)
Total Protein: 7.4 g/dL (ref 6.5–8.1)

## 2017-02-18 MED ORDER — ONDANSETRON HCL 4 MG PO TABS: 4.0000 mg | ORAL_TABLET | Freq: Three times a day (TID) | ORAL | 0 refills | Status: DC | PRN

## 2017-02-18 MED ORDER — SODIUM CHLORIDE 0.9 % IV BOLUS (SEPSIS)
1000.0000 mL | Freq: Once | INTRAVENOUS | Status: AC
  Administered 2017-02-18: 1000 mL via INTRAVENOUS

## 2017-02-18 MED ORDER — ONDANSETRON HCL 4 MG/2ML IJ SOLN
4.0000 mg | Freq: Once | INTRAMUSCULAR | Status: AC
  Administered 2017-02-18: 4 mg via INTRAVENOUS
  Filled 2017-02-18: qty 2

## 2017-02-18 MED ORDER — KETOROLAC TROMETHAMINE 15 MG/ML IJ SOLN
15.0000 mg | Freq: Once | INTRAMUSCULAR | Status: AC
  Administered 2017-02-18: 15 mg via INTRAVENOUS
  Filled 2017-02-18: qty 1

## 2017-02-18 MED ORDER — METOCLOPRAMIDE HCL 5 MG/ML IJ SOLN
10.0000 mg | Freq: Once | INTRAMUSCULAR | Status: AC
  Administered 2017-02-18: 10 mg via INTRAVENOUS
  Filled 2017-02-18: qty 2

## 2017-02-18 NOTE — ED Notes (Signed)
Report given to Next shift RN.

## 2017-02-18 NOTE — ED Notes (Signed)
Bed: ZO10 Expected date: 02/18/17 Expected time:  Means of arrival:  Comments: migraine

## 2017-02-18 NOTE — ED Triage Notes (Signed)
Patient states his mirgraine started on Sunday and has not stopped. Also started having chills and a little upper bilateral abdominal pain. No vomiting but has felt nausous.

## 2017-02-18 NOTE — ED Notes (Signed)
Patient is A & O x4.  He understood AVS instructions without any questions.

## 2017-02-18 NOTE — ED Provider Notes (Signed)
[facility]-EMERGENCY DEPT Provider Note   CSN: [id number] Arrival date & time: 02/18/17  0725     History   Chief Complaint Chief Complaint  Patient presents with  . Migraine    HPI Riley Davis is a 26 y.o. male.  The history is provided by the patient. No language interpreter was used.  Migraine     Riley Davis is a 26 y.o. male who presents to the Emergency Department complaining of multiple complaints.  Last Tuesday he developed nausea that he needed to leave work with associated diarrhea (2 episodes daily).  He has associated chills.  He also reports 2 days of headache.  Headache is located on the right side of his head. It is sharp in nature and comes and goes. It is similar to prior headaches in the past. He feels hot and cold, generalized weakness.  He tried excedrin migraine without relief.  He has dry heaves and central abdominal discomfort (since Wednesday).   Denies fever, sob, chest pain, dysuria.  Denies medical problems.  He has intermittent nausea for the last two years, Symptoms seem to be more frequent during times of stress.  Past Medical History:  Diagnosis Date  . Chicken pox   . Frequent headaches   . GERD (gastroesophageal reflux disease)   . Migraines     Patient Active Problem List   Diagnosis Date Noted  . Migraine without aura and without status migrainosus, not intractable 11/03/2015  . GERD (gastroesophageal reflux disease) 07/05/2015  . Migraines 07/05/2015    History reviewed. No pertinent surgical history.     Home Medications    Prior to Admission medications   Medication Sig Start Date End Date Taking? Authorizing Provider  aspirin-acetaminophen-caffeine (EXCEDRIN MIGRAINE) [phone] MG tablet Take by mouth every 6 (six) hours as needed for headache.    Historical Provider, MD  nortriptyline (PAMELOR) 10 MG capsule Take 1 capsule (10 mg total) by mouth at bedtime. 10/07/16   Drema Dallas, DO  ondansetron (ZOFRAN) 4 MG tablet  Take 1 tablet (4 mg total) by mouth every 8 (eight) hours as needed for nausea or vomiting. 02/18/17   Tilden Fossa, MD  pseudoephedrine (SUDAFED) 30 MG tablet Take 30 mg by mouth every 4 (four) hours as needed for congestion.    Historical Provider, MD    Family History Family History  Problem Relation Age of Onset  . Cancer Father     lung cancer    Social History Social History  Substance Use Topics  . Smoking status: Never Smoker  . Smokeless tobacco: Never Used  . Alcohol use 0.0 oz/week     Comment: socailly      Allergies   Patient has no known allergies.   Review of Systems Review of Systems  All other systems reviewed and are negative.    Physical Exam Updated Vital Signs BP 132/83 (BP Location: Right Arm)   Pulse (!) 102   Temp 98.7 F (37.1 C) (Oral)   Resp 16   Ht  (1.854 m)   Wt 240 lb (108.9 kg)   SpO2 99%   BMI 31.66 kg/m   Physical Exam  Constitutional: He is oriented to person, place, and time. He appears well-developed and well-nourished.  HENT:  Head: Normocephalic and atraumatic.  Eyes: Pupils are equal, round, and reactive to light.  Neck: Neck supple.  Cardiovascular: Normal rate and regular rhythm.   No murmur heard. Pulmonary/Chest: Effort normal and breath sounds normal. No respiratory  distress.  Abdominal: Soft. There is no tenderness. There is no rebound and no guarding.  Musculoskeletal: He exhibits no edema or tenderness.  Neurological: He is alert and oriented to person, place, and time.  5 out of 5 strength in all 4 extremities, no facial asymmetry  Skin: Skin is warm and dry.  Psychiatric: He has a normal mood and affect. His behavior is normal.  Nursing note and vitals reviewed.    ED Treatments / Results  Labs (all labs ordered are listed, but only abnormal results are displayed) Labs Reviewed  COMPREHENSIVE METABOLIC PANEL - Abnormal; Notable for the following:       Result Value   Glucose, Bld 100 (*)     All other components within normal limits  CBC WITH DIFFERENTIAL/PLATELET    EKG  EKG Interpretation None       Radiology No results found.  Procedures Procedures (including critical care time)  Medications Ordered in ED Medications  sodium chloride 0.9 % bolus 1,000 mL (1,000 mLs Intravenous New Bag/Given 02/18/17 0853)  metoCLOPramide (REGLAN) injection 10 mg (10 mg Intravenous Given 02/18/17 0853)     Initial Impression / Assessment and Plan / ED Course  I have reviewed the triage vital signs and the nursing notes.  Pertinent labs & imaging results that were available during my care of the patient were reviewed by me and considered in my medical decision making (see chart for details).     Patient here for evaluation of recurrent nausea, right-sided headache, hot and cold spells. He is nontoxic appearing on examination with a nonfocal neurologic examination. Abdominal examination is benign with no evidence of acute abdomen. His symptoms do appear to worsen in times of stress and question some component of anxiety triggering his symptoms. Current clinical picture is not consistent with subarachnoid hemorrhage, intracranial mass, meningitis, acute abdomen. Patient feeling improved on repeat assessment. Counseled patient on home care for recurrent nausea. Discussed importance of PCP as well as therapy follow-up for repeat assessment. Discussed return precautions.  Final Clinical Impressions(s) / ED Diagnoses   Final diagnoses:  Nausea    New Prescriptions New Prescriptions   ONDANSETRON (ZOFRAN) 4 MG TABLET    Take 1 tablet (4 mg total) by mouth every 8 (eight) hours as needed for nausea or vomiting.     Tilden Fossa, MD 02/18/17 [phone]

## 2017-03-06 ENCOUNTER — Telehealth: Payer: BC Managed Care – PPO | Admitting: Nurse Practitioner

## 2017-03-06 ENCOUNTER — Encounter: Payer: Self-pay | Admitting: Adult Health

## 2017-03-06 DIAGNOSIS — R197 Diarrhea, unspecified: Secondary | ICD-10-CM

## 2017-03-06 NOTE — Progress Notes (Signed)
Based on what you shared with me it looks like you have a serious condition that should be evaluated in a face to face office visit.  NOTE: Even if you have entered your credit card information for this eVisit, you will not be charged.   If you are having a true medical emergency please call 911.  If you need an urgent face to face visit, [facility] has four urgent care centers for your convenience.  If you need care fast and have a high deductible or no insurance consider:   [email or web address]  [phone]  [address], [address] [place], [place] [address] 8 am to 8 pm Monday-Friday 10 am to 4 pm Saturday-Sunday   The following sites will take your  insurance:    . [facility]  [phone] Get Driving Directions Find a Provider at this Location  [address] , [place] [address] . 10 am to 8 pm Monday-Friday . 12 pm to 8 pm Saturday-Sunday   . [facility]  [phone] Get Driving Directions Find a Provider at this Location  [address], [address] [place], [place] [address] . 8 am to 8 pm Monday-Friday . 9 am to 6 pm Saturday . 11 am to 6 pm Sunday   . [facility] [place]  [phone] Get Driving Directions  [address] [place], [place] [address] . 8 am to 8 pm Monday-Friday . 8 am to 4 pm Saturday-Sunday   Your e-visit answers were reviewed by a board certified advanced clinical practitioner to complete your personal care plan.  Thank you for using e-Visits.  

## 2017-03-07 ENCOUNTER — Ambulatory Visit: Payer: BC Managed Care – PPO | Admitting: Adult Health

## 2017-03-11 ENCOUNTER — Encounter: Payer: Self-pay | Admitting: Adult Health

## 2017-03-11 ENCOUNTER — Ambulatory Visit (INDEPENDENT_AMBULATORY_CARE_PROVIDER_SITE_OTHER): Payer: BC Managed Care – PPO | Admitting: Adult Health

## 2017-03-11 VITALS — BP 108/64 | Temp 98.0°F | Ht 73.0 in | Wt 239.4 lb

## 2017-03-11 DIAGNOSIS — K219 Gastro-esophageal reflux disease without esophagitis: Secondary | ICD-10-CM

## 2017-03-11 MED ORDER — PANTOPRAZOLE SODIUM 40 MG PO TBEC: 40.0000 mg | DELAYED_RELEASE_TABLET | Freq: Every day | ORAL | 1 refills | Status: DC

## 2017-03-11 NOTE — Progress Notes (Signed)
Subjective:    Patient ID: Riley Davis, male    DOB: [birth year], 26 y.o.   MRN: [id number]  HPI  26 year old male who  has a past medical history of Chicken pox; Frequent headaches; GERD (gastroesophageal reflux disease); and Migraines. He presents to the office today for the complaint of diarrhea/constipation and abdominal pain that is especially apparent 30 minutes after eating. This has been an ongoing issue with the patient. The diarrhea had persisted for approx 1.5 weeks straight and then he became constipated. He had a BM this morning which was " a little loose". The abdominal pain is described as " burning and sharp" and is located in the epigastric area. He denies waking up with a sour taste in his mouth.   He denies blood or mucus in the stool. He denies any vomiting or fevers   He does not feel ill.   His diet has consisted of " a lot of fried foods" but has since started to work on diet .   He was placed on Protonix for these symptoms in the past but believes he stopped taking the medication because " I was feeling better".   Review of Systems See HPI   Past Medical History:  Diagnosis Date  . Chicken pox   . Frequent headaches   . GERD (gastroesophageal reflux disease)   . Migraines     Social History   Social History  . Marital status: Single    Spouse name: N/A  . Number of children: N/A  . Years of education: N/A   Occupational History  . Not on file.   Social History Main Topics  . Smoking status: Never Smoker  . Smokeless tobacco: Never Used  . Alcohol use 0.0 oz/week     Comment: socailly   . Drug use: No  . Sexual activity: Not on file   Other Topics Concern  . 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    Not married       No past surgical history on file.  Family History  Problem Relation Age of Onset  . Cancer Father     lung cancer    No Known Allergies  Current Outpatient Prescriptions on File  Prior to Visit  Medication Sig Dispense Refill  . aspirin-acetaminophen-caffeine (EXCEDRIN EXTRA STRENGTH) 250-250-65 MG tablet Take 1 tablet by mouth every 6 (six) hours as needed for headache or migraine.    . nortriptyline (PAMELOR) 10 MG capsule Take 1 capsule (10 mg total) by mouth at bedtime. 90 capsule 1  . ondansetron (ZOFRAN) 4 MG tablet Take 1 tablet (4 mg total) by mouth every 8 (eight) hours as needed for nausea or vomiting. 12 tablet 0   No current facility-administered medications on file prior to visit.     BP 108/64 (BP Location: Left Arm, Patient Position: Sitting, Cuff Size: Normal)   Temp 98 F (36.7 C) (Oral)   Ht  (1.854 m)   Wt 239 lb 6.4 oz (108.6 kg)   BMI 31.59 kg/m       Objective:   Physical Exam  Constitutional: He is oriented to person, place, and time. He appears well-developed and well-nourished. No distress.  HENT:  Head: Normocephalic and atraumatic.  Right Ear: External ear normal.  Left Ear: External ear normal.  Nose: Nose normal.  Mouth/Throat: Oropharynx is clear and moist. No oropharyngeal exudate.  Eyes: Conjunctivae and EOM are  normal. Pupils are equal, round, and reactive to light. Right eye exhibits no discharge. Left eye exhibits no discharge.  Cardiovascular: Normal rate, regular rhythm, normal heart sounds and intact distal pulses.  Exam reveals no gallop and no friction rub.   No murmur heard. Pulmonary/Chest: Effort normal and breath sounds normal. No respiratory distress. He has no wheezes. He has no rales. He exhibits no tenderness.  Abdominal: Soft. Bowel sounds are normal. He exhibits no distension and no mass. There is tenderness in the epigastric area. There is no rebound and no guarding.  Neurological: He is alert and oriented to person, place, and time.  Skin: Skin is warm and dry. No rash noted. He is not diaphoretic. No erythema. No pallor.  Psychiatric: He has a normal mood and affect. His behavior is normal. Judgment  and thought content normal.  Nursing note and vitals reviewed.     Assessment & Plan:  1. Gastroesophageal reflux disease without esophagitis - Symptoms consistent with GERD. Will placed back on Protonix. Advised heart healthy diet.  - Follow up if no improvement  - Consider GI referral  - pantoprazole (PROTONIX) 40 MG tablet; Take 1 tablet (40 mg total) by mouth daily.  Dispense: 90 tablet; Refill: 1  Shirline Frees, NP

## 2017-03-17 ENCOUNTER — Encounter: Payer: Self-pay | Admitting: Adult Health

## 2017-03-18 NOTE — Telephone Encounter (Signed)
Pt is asking for refill on Zofran.  Dr. Durene Cal - Please advise. Thanks!

## 2017-03-19 ENCOUNTER — Other Ambulatory Visit: Payer: Self-pay

## 2017-03-19 MED ORDER — ONDANSETRON HCL 4 MG PO TABS
4.0000 mg | ORAL_TABLET | Freq: Three times a day (TID) | ORAL | 1 refills | Status: DC | PRN
Start: 2017-03-19 — End: 2017-10-08

## 2017-03-19 MED ORDER — ONDANSETRON HCL 4 MG PO TABS: 4.0000 mg | ORAL_TABLET | Freq: Three times a day (TID) | ORAL | 1 refills | Status: DC | PRN

## 2017-03-25 ENCOUNTER — Encounter: Payer: Self-pay | Admitting: Adult Health

## 2017-03-25 ENCOUNTER — Other Ambulatory Visit: Payer: Self-pay | Admitting: Adult Health

## 2017-03-25 MED ORDER — DICYCLOMINE HCL 10 MG PO CAPS: 10.0000 mg | ORAL_CAPSULE | Freq: Three times a day (TID) | ORAL | 0 refills | Status: DC

## 2017-06-17 ENCOUNTER — Ambulatory Visit: Payer: BC Managed Care – PPO | Admitting: Adult Health

## 2017-06-17 DIAGNOSIS — Z0289 Encounter for other administrative examinations: Secondary | ICD-10-CM

## 2017-06-18 ENCOUNTER — Encounter: Payer: Self-pay | Admitting: Adult Health

## 2017-06-25 ENCOUNTER — Encounter: Payer: Self-pay | Admitting: Adult Health

## 2017-06-25 ENCOUNTER — Ambulatory Visit (INDEPENDENT_AMBULATORY_CARE_PROVIDER_SITE_OTHER): Payer: BC Managed Care – PPO | Admitting: Adult Health

## 2017-06-25 VITALS — BP 110/80 | Temp 98.0°F | Wt 239.0 lb

## 2017-06-25 DIAGNOSIS — H6691 Otitis media, unspecified, right ear: Secondary | ICD-10-CM | POA: Diagnosis not present

## 2017-06-25 DIAGNOSIS — Z23 Encounter for immunization: Secondary | ICD-10-CM | POA: Diagnosis not present

## 2017-06-25 DIAGNOSIS — F419 Anxiety disorder, unspecified: Secondary | ICD-10-CM | POA: Diagnosis not present

## 2017-06-25 MED ORDER — CITALOPRAM HYDROBROMIDE 10 MG PO TABS: 10.0000 mg | ORAL_TABLET | Freq: Every day | ORAL | 3 refills | Status: DC

## 2017-06-25 NOTE — Progress Notes (Signed)
Subjective:    Patient ID: Riley BerlinJoshua Choe Davis, male    DOB: 12/24/1990, 26 y.o.   MRN: [id number]  HPI  26 year old male who  has a past medical history of Chicken pox; Frequent headaches; GERD (gastroesophageal reflux disease); and Migraines. He presents to the office today to discuss starting anxiety medication. He has been seen by his psychologist Herbert Seta( [facility]) and she recommend he start an anti anxiety medication. 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 He denies any depression   He would also like to see ENT as he has had chronic right side ear infections. He was recently treated by Urgent Care for otitis media     Review of Systems See HPI   Past Medical History:  Diagnosis Date  . Chicken pox   . Frequent headaches   . GERD (gastroesophageal reflux disease)   . Migraines     Social History   Social History  . Marital status: Single    Spouse name: N/A  . Number of children: N/A  . Years of education: N/A   Occupational History  . Not on file.   Social History Main Topics  . Smoking status: Never Smoker  . Smokeless tobacco: Never Used  . Alcohol use 0.0 oz/week     Comment: socailly   . Drug use: No  . Sexual activity: Not on file   Other Topics Concern  . 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    Not married       No past surgical history on file.  Family History  Problem Relation Age of Onset  . Cancer Father        lung cancer    No Known Allergies  Current Outpatient Prescriptions on File Prior to Visit  Medication Sig Dispense Refill  . aspirin-acetaminophen-caffeine (EXCEDRIN EXTRA STRENGTH) 250-250-65 MG tablet Take 1 tablet by mouth every 6 (six) hours as needed for headache or migraine.    . ondansetron (ZOFRAN) 4 MG tablet Take 1 tablet (4 mg total) by mouth every 8 (eight) hours as needed for nausea or  vomiting. 20 tablet 1   No current facility-administered medications on file prior to visit.     BP 110/80 (BP Location: Left Arm)   Temp 98 F (36.7 C) (Oral)   Wt 239 lb (108.4 kg)   BMI 31.53 kg/m       Objective:   Physical Exam  Constitutional: He is oriented to person, place, and time. He appears well-developed and well-nourished. No distress.  HENT:  Right Ear: Hearing, tympanic membrane, external ear and ear canal normal.  Left Ear: Hearing, tympanic membrane, external ear and ear canal normal.  Cardiovascular: Normal rate, regular rhythm, normal heart sounds and intact distal pulses.  Exam reveals no gallop and no friction rub.   No murmur heard. Pulmonary/Chest: Effort normal and breath sounds normal. No respiratory distress. He has no wheezes. He has no rales. He exhibits no tenderness.  Neurological: He is alert and oriented to person, place, and time.  Skin: Skin is warm and dry. No rash noted. He is not diaphoretic. No erythema. No pallor.  Psychiatric: He has a normal mood and affect. His behavior is normal. Judgment and thought content normal.  Nursing note and vitals reviewed.      Assessment & Plan:  1. Anxiety - Will start on Celexa 10 mg  - Reviewed side effects of medication with patient.  - Advised follow up in 4 months or sooner if needed  2. Chronic infection of right ear  - Ambulatory referral to ENT  3. Need for Tdap vaccination  - Tdap vaccine greater than or equal to 7yo IM  Shirline Frees, NP

## 2017-06-27 ENCOUNTER — Other Ambulatory Visit: Payer: Self-pay | Admitting: Adult Health

## 2017-06-27 ENCOUNTER — Encounter: Payer: Self-pay | Admitting: Adult Health

## 2017-06-27 MED ORDER — ONDANSETRON HCL 4 MG PO TABS: 4.0000 mg | ORAL_TABLET | Freq: Three times a day (TID) | ORAL | 1 refills | Status: DC | PRN

## 2017-07-08 ENCOUNTER — Encounter: Payer: Self-pay | Admitting: Adult Health

## 2017-08-08 ENCOUNTER — Encounter: Payer: Self-pay | Admitting: Adult Health

## 2017-08-09 ENCOUNTER — Encounter: Payer: Self-pay | Admitting: Adult Health

## 2017-08-12 NOTE — Telephone Encounter (Signed)
Please advise Cory, thanks.  

## 2017-08-19 ENCOUNTER — Other Ambulatory Visit: Payer: Self-pay | Admitting: Adult Health

## 2017-08-19 MED ORDER — SERTRALINE HCL 25 MG PO TABS: 25.0000 mg | ORAL_TABLET | Freq: Every day | ORAL | 3 refills | Status: DC

## 2017-10-07 ENCOUNTER — Ambulatory Visit: Payer: Self-pay | Admitting: *Deleted

## 2017-10-07 NOTE — Telephone Encounter (Signed)
At 11:00-11:30am today he experienced a bright light in his eyes last about 5-10 minutes.  His left hand felt numb briefly but is now fine.  "It feels normal"  Just has a mild HA over his right eye that has not resolved with Excedrin.   "Usually when this happens 1 Excedrin takes care of it but I've taken 2 with the 2nd one right before I called you"   He has had this happen before with a migraine but never this bad.  I asked him if he had ever been told he gets ocular migraines he said,  "No".   "My vision is fine after about 5-10 minutes and my hand is not numb".   He did mention that he had his wisdom teeth  removed about a year ago and ever since then he has been having sinus problems and frequent ear infections.  I got an appt with Shirline Freesory Nafziger the NP at [facility] on Nov. 21, 2018 at 2:30PM.  I instructed him to call back if his symptoms did not resolve and became worse.  I encouraged him to go home (he is in school now) and lay down in a cool, dark room with a cold pack over his right eye.  He acknowledged he understood the instructions and would go home and go to bed and would call us back if his symptoms happened again or got worse or the HA did not resolve.  Reason for Disposition . Headache is a chronic symptom (recurrent or ongoing AND present > 4 weeks)  Answer Assessment - Initial Assessment Questions 1. LOCATION: "Where does it hurt?"      Hurts above your right eye now.  It started with a really bright light in left eye lasted 5-10 minutes.   My left hand felt numb too but now that's gone.   This has happened before but not this bad. 2. ONSET: "When did the headache start?" (Minutes, hours or days)      Around 11:00am. 3. PATTERN: "Does the pain come and go, or has it been constant since it started?"     Constant 4. SEVERITY: "How bad is the pain?" and "What does it keep you from doing?"  (e.g., Scale 1-10; mild, moderate, or severe)   - MILD (1-3): doesn't interfere with  normal activities    - MODERATE (4-7): interferes with normal activities or awakens from sleep    - SEVERE (8-10): excruciating pain, unable to do any normal activities        1 Excedren usually takes care of my HA but I've taken 2 and it has helped but not made it go away.    5. RECURRENT SYMPTOM: "Have you ever had headaches before?" If so, ask: "When was the last time?" and "What happened that time?"      Yes.  It's been over a yr ago and it was nothing as bad as this. 6. CAUSE: "What do you think is causing the headache?"     I think so. 7. MIGRAINE: "Have you been diagnosed with migraine headaches?" If so, ask: "Is this headache similar?"      Yes.  Yes but near as bad as this. 8. HEAD INJURY: "Has there been any recent injury to the head?"      No 9. OTHER SYMPTOMS: "Do you have any other symptoms?" (fever, stiff neck, eye pain, sore throat, cold symptoms)     Just HA over right eye. 10. PREGNANCY: "Is there any chance  you are pregnant?" "When was your last menstrual period?"       N/A  Protocols used: HEADACHE-A-AH

## 2017-10-08 ENCOUNTER — Ambulatory Visit: Payer: BC Managed Care – PPO | Admitting: Adult Health

## 2017-10-08 ENCOUNTER — Encounter: Payer: Self-pay | Admitting: Adult Health

## 2017-10-08 VITALS — BP 120/82 | Temp 98.2°F | Wt 239.0 lb

## 2017-10-08 DIAGNOSIS — F419 Anxiety disorder, unspecified: Secondary | ICD-10-CM | POA: Diagnosis not present

## 2017-10-08 DIAGNOSIS — G43101 Migraine with aura, not intractable, with status migrainosus: Secondary | ICD-10-CM | POA: Diagnosis not present

## 2017-10-08 MED ORDER — SERTRALINE HCL 50 MG PO TABS: 50.0000 mg | ORAL_TABLET | Freq: Every day | ORAL | 1 refills | Status: DC

## 2017-10-08 NOTE — Progress Notes (Signed)
Subjective:    Patient ID: Riley Davis, male    DOB: 02/25/1991, 26 y.o.   MRN: [id number]  HPI  26 year old male who  has a past medical history of Chicken pox, Frequent headaches, GERD (gastroesophageal reflux disease), and Migraines.  Office today for an acute issue.  He reports that yesterday while in school at approximately 11 AM he experienced a flash of a "bright light" in bilateral eyes, this lasted about 5-10 minutes.  Report just prior to this he had migraine presenting over his right eye.  He denies any complete loss of vision but does endorse blurred vision that was more prominent on the right side and resolved within 20 minutes.   He denies any facial numbness, slurred speech, facial droop, or loss of coordination.   Noted on Zoloft back in October after a failed trial of Celexa.  Ports in the office today that for the most part his anxiety attacks have improved.  But he also feels as though his migraines have become less frequent since starting this medication.  He would like to try going up to 50 mg of the Zoloft to see if it helps control his anxiety and also helps with his migraines a little bit better  Review of Systems See HPI   Past Medical History:  Diagnosis Date  . Chicken pox   . Frequent headaches   . GERD (gastroesophageal reflux disease)   . Migraines     Social History   Socioeconomic History  . Marital status: Single    Spouse name: Not on file  . Number of children: Not on file  . Years of education: Not on file  . Highest education level: Not on file  Social Needs  . Financial resource strain: Not on file  . Food insecurity - worry: Not on file  . Food insecurity - inability: Not on file  . Transportation needs - medical: Not on file  . Transportation needs - non-medical: Not on file  Occupational History  . Not on file  Tobacco Use  . Smoking status: Never Smoker  . Smokeless tobacco: Never Used  Substance and Sexual Activity  .  Alcohol use: Yes    Alcohol/week: 0.0 oz    Comment: socailly   . Drug use: No  . Sexual activity: Not on file  Other Topics Concern  . 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    Not married    History reviewed. No pertinent surgical history.  Family History  Problem Relation Age of Onset  . Cancer Father        lung cancer    No Known Allergies  Current Outpatient Medications on File Prior to Visit  Medication Sig Dispense Refill  . aspirin-acetaminophen-caffeine (EXCEDRIN EXTRA STRENGTH) 250-250-65 MG tablet Take 1 tablet by mouth every 6 (six) hours as needed for headache or migraine.    . ondansetron (ZOFRAN) 4 MG tablet Take 1 tablet (4 mg total) by mouth every 8 (eight) hours as needed for nausea or vomiting. 20 tablet 1  . sertraline (ZOLOFT) 25 MG tablet Take 1 tablet (25 mg total) by mouth at bedtime. 30 tablet 3   No current facility-administered medications on file prior to visit.     BP 120/82 (BP Location: Left Arm)   Temp 98.2 F (36.8 C) (Oral)   Wt 239 lb (108.4 kg)   BMI 31.53 kg/m  Objective:   Physical Exam  Constitutional: He is oriented to person, place, and time. He appears well-developed and well-nourished. No distress.  Eyes: Conjunctivae are normal. Pupils are equal, round, and reactive to light.  Cardiovascular: Normal rate, regular rhythm, normal heart sounds and intact distal pulses. Exam reveals no gallop.  No murmur heard. Pulmonary/Chest: Effort normal and breath sounds normal. No respiratory distress. He has no wheezes. He has no rales. He exhibits no tenderness.  Neurological: He is alert and oriented to person, place, and time.  Skin: Skin is warm and dry. No rash noted. He is not diaphoretic. No erythema. No pallor.  Psychiatric: He has a normal mood and affect. His behavior is normal. Judgment and thought content normal.  Vitals reviewed.     Assessment & Plan:  1. Migraine with aura and with  status migrainosus, not intractable Appears as though Riley Davis had an ocular migraine.  This is the first that he has had and was understandablypretty scary for him.  I do not see a reason to get an MRI at this point in time.  Advised to follow-up if the symptoms continue - sertraline (ZOLOFT) 50 MG tablet; Take 1 tablet (50 mg total) by mouth at bedtime.  Dispense: 90 tablet; Refill: 1  2. Anxiety - Will increase Zoloft to 50  - sertraline (ZOLOFT) 50 MG tablet; Take 1 tablet (50 mg total) by mouth at bedtime.  Dispense: 90 tablet; Refill: 1  Shirline Freesory Malik Paar, NP

## 2017-10-19 ENCOUNTER — Encounter: Payer: Self-pay | Admitting: Adult Health

## 2017-11-04 ENCOUNTER — Encounter: Payer: Self-pay | Admitting: Adult Health

## 2017-11-04 ENCOUNTER — Ambulatory Visit: Payer: BC Managed Care – PPO | Admitting: Adult Health

## 2017-11-04 VITALS — BP 118/84 | Temp 98.8°F | Wt 242.0 lb

## 2017-11-04 DIAGNOSIS — G43001 Migraine without aura, not intractable, with status migrainosus: Secondary | ICD-10-CM

## 2017-11-04 DIAGNOSIS — M5412 Radiculopathy, cervical region: Secondary | ICD-10-CM | POA: Diagnosis not present

## 2017-11-04 NOTE — Progress Notes (Signed)
Subjective:    Patient ID: Riley BerlinJoshua Choe Welker, male    DOB: 10/08/1991, 26 y.o.   MRN: [id number]  HPI  26 year old male who  has a past medical history of Chicken pox, Frequent headaches, GERD (gastroesophageal reflux disease), and Migraines. He presents to the office today for the complaint of right arm/ hand numbness and right sided facial numbness that presented itself on December 2nd. This lasted about 5 minutes. Between the 2nd and the 14th of December he had " little headaches" and then on December 14th he had a " really bad migraine which caused my ears to " feel weird, like there was a lot of pressure in them."  He denies any blurred vision, facial droop, or slurred speech during this epside  During his last visit in November he experienced a migraine with aura. He felt as though his migraines and anxiety had improved on Zoloft 25 mg, we decided to increase Zoloft to 50 to see if there were any added benefits. He reports that he feels as though the increase has helped "a little bit". He feels as though he is getting migraines less and they are less severe   Review of Systems See HPI   Past Medical History:  Diagnosis Date  . Chicken pox   . Frequent headaches   . GERD (gastroesophageal reflux disease)   . Migraines     Social History   Socioeconomic History  . Marital status: Single    Spouse name: Not on file  . Number of children: Not on file  . Years of education: Not on file  . Highest education level: Not on file  Social Needs  . Financial resource strain: Not on file  . Food insecurity - worry: Not on file  . Food insecurity - inability: Not on file  . Transportation needs - medical: Not on file  . Transportation needs - non-medical: Not on file  Occupational History  . Not on file  Tobacco Use  . Smoking status: Never Smoker  . Smokeless tobacco: Never Used  Substance and Sexual Activity  . Alcohol use: Yes    Alcohol/week: 0.0 oz    Comment: socailly   .  Drug use: No  . Sexual activity: Not on file  Other Topics Concern  . 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    Not married    History reviewed. No pertinent surgical history.  Family History  Problem Relation Age of Onset  . Cancer Father        lung cancer    No Known Allergies  Current Outpatient Medications on File Prior to Visit  Medication Sig Dispense Refill  . aspirin-acetaminophen-caffeine (EXCEDRIN EXTRA STRENGTH) 250-250-65 MG tablet Take 1 tablet by mouth every 6 (six) hours as needed for headache or migraine.    . ondansetron (ZOFRAN) 4 MG tablet Take 1 tablet (4 mg total) by mouth every 8 (eight) hours as needed for nausea or vomiting. 20 tablet 1  . sertraline (ZOLOFT) 50 MG tablet Take 1 tablet (50 mg total) by mouth at bedtime. 90 tablet 1   No current facility-administered medications on file prior to visit.     BP 118/84 (BP Location: Left Arm)   Temp 98.8 F (37.1 C) (Oral)   Wt 242 lb (109.8 kg)   BMI 31.93 kg/m       Objective:   Physical Exam  Constitutional: He is oriented to person,  place, and time. He appears well-developed and well-nourished. No distress.  HENT:  Trace fluid behind bilateral TM's. No signs of infection   Eyes: Conjunctivae and EOM are normal. Pupils are equal, round, and reactive to light. Right eye exhibits no discharge. Left eye exhibits no discharge. No scleral icterus.  Neck: Normal range of motion. Neck supple.  Cardiovascular: Normal rate, regular rhythm, normal heart sounds and intact distal pulses. Exam reveals no gallop and no friction rub.  No murmur heard. Pulmonary/Chest: Effort normal and breath sounds normal. No respiratory distress. He has no wheezes. He has no rales. He exhibits no tenderness.  Lymphadenopathy:    He has no cervical adenopathy.  Neurological: He is alert and oriented to person, place, and time.  Skin: Skin is warm and dry. No rash noted. He is not diaphoretic.  No erythema. No pallor.  Psychiatric: He has a normal mood and affect. His behavior is normal. Thought content normal.  Nursing note and vitals reviewed.     Assessment & Plan:  1. Migraine without aura and with status migrainosus, not intractable - Will get MRI of brain and MRI of cervical spine. I am not convinced that his numbness and tingling was caused by a migraine. - MR Brain Wo Contrast; Future - MR Cervical Spine Wo Contrast; Future 2. Cervical radiculopathy - MR Cervical Spine Wo Contrast; Future   Shirline Freesory Lindsi Bayliss, NP

## 2017-11-22 ENCOUNTER — Ambulatory Visit
Admission: RE | Admit: 2017-11-22 | Discharge: 2017-11-22 | Disposition: A | Discharge status: Home / Self Care | Payer: BC Managed Care – PPO | Source: Ambulatory Visit | Attending: Adult Health | Admitting: Adult Health

## 2017-11-22 DIAGNOSIS — M5412 Radiculopathy, cervical region: Secondary | ICD-10-CM

## 2017-11-22 DIAGNOSIS — G43001 Migraine without aura, not intractable, with status migrainosus: Secondary | ICD-10-CM

## 2017-12-02 ENCOUNTER — Encounter: Payer: Self-pay | Admitting: Adult Health

## 2017-12-02 ENCOUNTER — Other Ambulatory Visit: Payer: Self-pay | Admitting: Adult Health

## 2017-12-02 MED ORDER — ONDANSETRON HCL 4 MG PO TABS: 4.0000 mg | ORAL_TABLET | Freq: Three times a day (TID) | ORAL | 1 refills | Status: DC | PRN

## 2018-04-11 ENCOUNTER — Other Ambulatory Visit: Payer: Self-pay | Admitting: Adult Health

## 2018-04-11 DIAGNOSIS — F419 Anxiety disorder, unspecified: Secondary | ICD-10-CM

## 2018-04-11 DIAGNOSIS — G43101 Migraine with aura, not intractable, with status migrainosus: Secondary | ICD-10-CM

## 2018-04-13 ENCOUNTER — Encounter: Payer: Self-pay | Admitting: Adult Health

## 2018-04-14 NOTE — Telephone Encounter (Signed)
Sent to the pharmacy by e-scribe for 90 days per Cory. 

## 2018-07-09 ENCOUNTER — Other Ambulatory Visit: Payer: Self-pay | Admitting: Adult Health

## 2018-07-09 DIAGNOSIS — G43101 Migraine with aura, not intractable, with status migrainosus: Secondary | ICD-10-CM

## 2018-07-09 DIAGNOSIS — F419 Anxiety disorder, unspecified: Secondary | ICD-10-CM

## 2018-07-26 ENCOUNTER — Ambulatory Visit: Payer: Self-pay | Admitting: Family

## 2018-07-26 VITALS — BP 120/80 | HR 100 | Temp 98.6°F | Resp 16 | Wt 247.2 lb

## 2018-07-26 DIAGNOSIS — J069 Acute upper respiratory infection, unspecified: Secondary | ICD-10-CM

## 2018-07-26 LAB — POCT RAPID STREP A (OFFICE): Rapid Strep A Screen: NEGATIVE

## 2018-07-26 MED ORDER — FEXOFENADINE-PSEUDOEPHED ER 60-120 MG PO TB12: 1.0000 | ORAL_TABLET | Freq: Two times a day (BID) | ORAL | 0 refills | Status: DC

## 2018-07-26 NOTE — Progress Notes (Signed)
Subjective:     Patient ID: Riley Davis, male   DOB: 07/27/1991, 27 y.o.   MRN: [id number]  HPI 27 year old male is in today with c/o cough, congestion, sore throat, fatigue and right ear pain  Review of Systems  Constitutional: Positive for fatigue.  HENT: Positive for congestion, ear pain, postnasal drip, rhinorrhea, sneezing and sore throat. Negative for sinus pressure and sinus pain.   Eyes: Negative.   Respiratory: Positive for cough. Negative for shortness of breath and wheezing.   Cardiovascular: Negative.   Gastrointestinal: Negative.   Musculoskeletal: Negative.   Allergic/Immunologic: Negative.   Neurological: Negative.   Psychiatric/Behavioral: Negative.    Past Medical History:  Diagnosis Date  . Chicken pox   . Frequent headaches   . GERD (gastroesophageal reflux disease)   . Migraines     Social History   Socioeconomic History  . Marital status: Single    Spouse name: Not on file  . Number of children: Not on file  . Years of education: Not on file  . Highest education level: Not on file  Occupational History  . Not on file  Social Needs  . Financial resource strain: Not on file  . Food insecurity:    Worry: Not on file    Inability: Not on file  . Transportation needs:    Medical: Not on file    Non-medical: Not on file  Tobacco Use  . Smoking status: Never Smoker  . Smokeless tobacco: Never Used  Substance and Sexual Activity  . Alcohol use: Yes    Alcohol/week: 0.0 standard drinks    Comment: socailly   . Drug use: No  . Sexual activity: Not on file  Lifestyle  . Physical activity:    Days per week: Not on file    Minutes per session: Not on file  . Stress: Not on file  Relationships  . Social connections:    Talks on phone: Not on file    Gets together: Not on file    Attends religious service: Not on file    Active member of club or organization: Not on file    Attends meetings of clubs or organizations: Not on file   Relationship status: Not on file  . Intimate partner violence:    Fear of current or ex partner: Not on file    Emotionally abused: Not on file    Physically abused: Not on file    Forced sexual activity: Not on file  Other Topics Concern  . 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    Not married    No past surgical history on file.  Family History  Problem Relation Age of Onset  . Cancer Father        lung cancer    No Known Allergies  Current Outpatient Medications on File Prior to Visit  Medication Sig Dispense Refill  . aspirin-acetaminophen-caffeine (EXCEDRIN EXTRA STRENGTH) 250-250-65 MG tablet Take 1 tablet by mouth every 6 (six) hours as needed for headache or migraine.    . sertraline (ZOLOFT) 50 MG tablet TAKE 1 TABLET(50 MG) BY MOUTH AT BEDTIME 90 tablet 0  . ondansetron (ZOFRAN) 4 MG tablet Take 1 tablet (4 mg total) by mouth every 8 (eight) hours as needed for nausea or vomiting. (Patient not taking: Reported on 07/26/2018) 20 tablet 1   No current facility-administered medications on file prior to visit.     BP 120/80 (  BP Location: Right Arm, Patient Position: Sitting, Cuff Size: Normal)   Pulse 100   Temp 98.6 F (37 C) (Oral)   Resp 16   Wt 247 lb 3.2 oz (112.1 kg)   SpO2 97%   BMI 32.61 kg/m chart    Objective:   Physical Exam  Constitutional: He is oriented to person, place, and time. He appears well-developed and well-nourished.  HENT:  Right Ear: Tympanic membrane normal. No swelling or tenderness.  Left Ear: Tympanic membrane normal. No swelling or tenderness.  Mouth/Throat: Mucous membranes are normal. Oral lesions present. Posterior oropharyngeal erythema present. No oropharyngeal exudate.  Neck: Normal range of motion. Neck supple.  Cardiovascular: Normal rate, regular rhythm and normal heart sounds.  Pulmonary/Chest: Effort normal and breath sounds normal.  Neurological: He is alert and oriented to person, place,  and time.  Skin: Skin is warm and dry.       Assessment:     Riley Davis was seen today for nasal congestion, sore throat, chills and ear pain.  Diagnoses and all orders for this visit:  Viral upper respiratory infection  Other orders -     fexofenadine-pseudoephedrine (ALLEGRA-D ALLERGY & CONGESTION) 60-120 MG 12 hr tablet; Take 1 tablet by mouth 2 (two) times daily.      Plan:     Call with any questions or concerns.

## 2018-07-26 NOTE — Addendum Note (Signed)
Addended by: Corinna Lines on: 07/26/2018 03:37 PM   Modules accepted: Orders

## 2018-07-26 NOTE — Patient Instructions (Signed)
Upper Respiratory Infection, Adult Most upper respiratory infections (URIs) are a viral infection of the air passages leading to the lungs. A URI affects the nose, throat, and upper air passages. The most common type of URI is nasopharyngitis and is typically referred to as "the common cold." URIs run their course and usually go away on their own. Most of the time, a URI does not require medical attention, but sometimes a bacterial infection in the upper airways can follow a viral infection. This is called a secondary infection. Sinus and middle ear infections are common types of secondary upper respiratory infections. Bacterial pneumonia can also complicate a URI. A URI can worsen asthma and chronic obstructive pulmonary disease (COPD). Sometimes, these complications can require emergency medical care and may be life threatening. What are the causes? Almost all URIs are caused by viruses. A virus is a type of germ and can spread from one person to another. What increases the risk? You may be at risk for a URI if:  You smoke.  You have chronic heart or lung disease.  You have a weakened defense (immune) system.  You are very young or very old.  You have nasal allergies or asthma.  You work in crowded or poorly ventilated areas.  You work in health care facilities or schools.  What are the signs or symptoms? Symptoms typically develop 2-3 days after you come in contact with a cold virus. Most viral URIs last 7-10 days. However, viral URIs from the influenza virus (flu virus) can last 14-18 days and are typically more severe. Symptoms may include:  Runny or stuffy (congested) nose.  Sneezing.  Cough.  Sore throat.  Headache.  Fatigue.  Fever.  Loss of appetite.  Pain in your forehead, behind your eyes, and over your cheekbones (sinus pain).  Muscle aches.  How is this diagnosed? Your health care provider may diagnose a URI by:  Physical exam.  Tests to check that your  symptoms are not due to another condition such as: ? Strep throat. ? Sinusitis. ? Pneumonia. ? Asthma.  How is this treated? A URI goes away on its own with time. It cannot be cured with medicines, but medicines may be prescribed or recommended to relieve symptoms. Medicines may help:  Reduce your fever.  Reduce your cough.  Relieve nasal congestion.  Follow these instructions at home:  Take medicines only as directed by your health care provider.  Gargle warm saltwater or take cough drops to comfort your throat as directed by your health care provider.  Use a warm mist humidifier or inhale steam from a shower to increase air moisture. This may make it easier to breathe.  Drink enough fluid to keep your urine clear or pale yellow.  Eat soups and other clear broths and maintain good nutrition.  Rest as needed.  Return to work when your temperature has returned to normal or as your health care provider advises. You may need to stay home longer to avoid infecting others. You can also use a face mask and careful hand washing to prevent spread of the virus.  Increase the usage of your inhaler if you have asthma.  Do not use any tobacco products, including cigarettes, chewing tobacco, or electronic cigarettes. If you need help quitting, ask your health care provider. How is this prevented? The best way to protect yourself from getting a cold is to practice good hygiene.  Avoid oral or hand contact with people with cold symptoms.  Wash your   hands often if contact occurs.  There is no clear evidence that vitamin C, vitamin E, echinacea, or exercise reduces the chance of developing a cold. However, it is always recommended to get plenty of rest, exercise, and practice good nutrition. Contact a health care provider if:  You are getting worse rather than better.  Your symptoms are not controlled by medicine.  You have chills.  You have worsening shortness of breath.  You have  brown or red mucus.  You have yellow or brown nasal discharge.  You have pain in your face, especially when you bend forward.  You have a fever.  You have swollen neck glands.  You have pain while swallowing.  You have white areas in the back of your throat. Get help right away if:  You have severe or persistent: ? Headache. ? Ear pain. ? Sinus pain. ? Chest pain.  You have chronic lung disease and any of the following: ? Wheezing. ? Prolonged cough. ? Coughing up blood. ? A change in your usual mucus.  You have a stiff neck.  You have changes in your: ? Vision. ? Hearing. ? Thinking. ? Mood. This information is not intended to replace advice given to you by your health care provider. Make sure you discuss any questions you have with your health care provider. Document Released: 04/30/2001 Document Revised: 07/07/2016 Document Reviewed: 02/09/2014 Elsevier Interactive Patient Education  2018 [employer]  

## 2018-07-28 ENCOUNTER — Telehealth: Payer: Self-pay

## 2018-07-28 NOTE — Telephone Encounter (Signed)
I left a message asking the patient to call us back. 

## 2018-10-04 ENCOUNTER — Other Ambulatory Visit: Payer: Self-pay | Admitting: Adult Health

## 2018-10-04 DIAGNOSIS — G43101 Migraine with aura, not intractable, with status migrainosus: Secondary | ICD-10-CM

## 2018-10-04 DIAGNOSIS — F419 Anxiety disorder, unspecified: Secondary | ICD-10-CM

## 2019-01-01 ENCOUNTER — Encounter: Payer: Self-pay | Admitting: Adult Health

## 2019-01-01 ENCOUNTER — Other Ambulatory Visit: Payer: Self-pay | Admitting: Adult Health

## 2019-01-01 DIAGNOSIS — F419 Anxiety disorder, unspecified: Secondary | ICD-10-CM

## 2019-01-01 DIAGNOSIS — G43101 Migraine with aura, not intractable, with status migrainosus: Secondary | ICD-10-CM

## 2019-01-01 MED ORDER — SERTRALINE HCL 50 MG PO TABS: 50.0000 mg | ORAL_TABLET | Freq: Every day | ORAL | 0 refills | Status: DC

## 2019-01-01 NOTE — Telephone Encounter (Signed)
Pt has scheduled appt with Kandee Keen on 01/05/2019.  30 day supply sent to the pharmacy.  Nothing further needed.

## 2019-01-05 ENCOUNTER — Ambulatory Visit (INDEPENDENT_AMBULATORY_CARE_PROVIDER_SITE_OTHER): Payer: BC Managed Care – PPO | Admitting: Adult Health

## 2019-01-05 ENCOUNTER — Encounter: Payer: Self-pay | Admitting: Adult Health

## 2019-01-05 VITALS — BP 112/72 | Temp 98.2°F | Ht 73.0 in | Wt 261.0 lb

## 2019-01-05 DIAGNOSIS — F419 Anxiety disorder, unspecified: Secondary | ICD-10-CM | POA: Diagnosis not present

## 2019-01-05 DIAGNOSIS — G43009 Migraine without aura, not intractable, without status migrainosus: Secondary | ICD-10-CM | POA: Diagnosis not present

## 2019-01-05 DIAGNOSIS — G43101 Migraine with aura, not intractable, with status migrainosus: Secondary | ICD-10-CM

## 2019-01-05 DIAGNOSIS — Z23 Encounter for immunization: Secondary | ICD-10-CM | POA: Diagnosis not present

## 2019-01-05 DIAGNOSIS — Z0000 Encounter for general adult medical examination without abnormal findings: Secondary | ICD-10-CM | POA: Diagnosis not present

## 2019-01-05 LAB — CBC WITH DIFFERENTIAL/PLATELET
BASOS PCT: 0.8 % (ref 0.0–3.0)
Basophils Absolute: 0.1 10*3/uL (ref 0.0–0.1)
EOS ABS: 0.6 10*3/uL (ref 0.0–0.7)
Eosinophils Relative: 7.3 % — ABNORMAL HIGH (ref 0.0–5.0)
HCT: 49.5 % (ref 39.0–52.0)
HEMOGLOBIN: 16.1 g/dL (ref 13.0–17.0)
Lymphocytes Relative: 24.4 % (ref 12.0–46.0)
Lymphs Abs: 1.9 10*3/uL (ref 0.7–4.0)
MCHC: 32.6 g/dL (ref 30.0–36.0)
MCV: 90.1 fl (ref 78.0–100.0)
MONO ABS: 0.6 10*3/uL (ref 0.1–1.0)
Monocytes Relative: 7.3 % (ref 3.0–12.0)
NEUTROS PCT: 60.2 % (ref 43.0–77.0)
Neutro Abs: 4.8 10*3/uL (ref 1.4–7.7)
Platelets: 327 10*3/uL (ref 150.0–400.0)
RBC: 5.49 Mil/uL (ref 4.22–5.81)
RDW: 13.6 % (ref 11.5–15.5)
WBC: 7.9 10*3/uL (ref 4.0–10.5)

## 2019-01-05 LAB — COMPREHENSIVE METABOLIC PANEL
ALBUMIN: 4.4 g/dL (ref 3.5–5.2)
ALT: 25 U/L (ref 0–53)
AST: 14 U/L (ref 0–37)
Alkaline Phosphatase: 72 U/L (ref 39–117)
BUN: 14 mg/dL (ref 6–23)
CHLORIDE: 101 meq/L (ref 96–112)
CO2: 29 meq/L (ref 19–32)
CREATININE: 0.82 mg/dL (ref 0.40–1.50)
Calcium: 9.6 mg/dL (ref 8.4–10.5)
GFR: 112.66 mL/min (ref >60.00)
GLUCOSE: 80 mg/dL (ref 70–99)
Potassium: 4 mEq/L (ref 3.5–5.1)
SODIUM: 139 meq/L (ref 135–145)
Total Bilirubin: 0.4 mg/dL (ref 0.2–1.2)
Total Protein: 7 g/dL (ref 6.0–8.3)

## 2019-01-05 LAB — HEMOGLOBIN A1C: HEMOGLOBIN A1C: 5.7 % (ref 4.6–6.5)

## 2019-01-05 LAB — LIPID PANEL
CHOL/HDL RATIO: 5
Cholesterol: 209 mg/dL — ABNORMAL HIGH (ref 0–200)
HDL: 44.2 mg/dL (ref >39.00)
LDL CALC: 131 mg/dL — AB (ref 0–99)
NONHDL: 164.7
Triglycerides: 171 mg/dL — ABNORMAL HIGH (ref 0.0–149.0)
VLDL: 34.2 mg/dL (ref 0.0–40.0)

## 2019-01-05 LAB — TSH: TSH: 2.99 u[IU]/mL (ref 0.35–4.50)

## 2019-01-05 MED ORDER — ONDANSETRON HCL 4 MG PO TABS: 4.0000 mg | ORAL_TABLET | Freq: Three times a day (TID) | ORAL | 1 refills | Status: DC | PRN

## 2019-01-05 MED ORDER — SERTRALINE HCL 50 MG PO TABS: 50.0000 mg | ORAL_TABLET | Freq: Every day | ORAL | 1 refills | Status: DC

## 2019-01-05 NOTE — Progress Notes (Signed)
Subjective:    Patient ID: Janet BerlinJoshua Choe Daniely, male    DOB: 01/09/1991, 28 y.o.   MRN: [id number]  HPI Patient presents for yearly preventative medicine examination. He is a pleasant 28 year old male who  has a past medical history of Chicken pox, Frequent headaches, GERD (gastroesophageal reflux disease), and Migraines.  Anxiety - is controlled with Zoloft 50 mg   Migraines - is having less frequent migraines. He takes Excedrin when needed. Feels as though Zoloft is helping with his migranes as well.   All immunizations and health maintenance protocols were reviewed with the patient and needed orders were placed. Needs flu shot   Appropriate screening laboratory values were ordered for the patient including screening of hyperlipidemia, renal function and hepatic function.  Medication reconciliation,  past medical history, social history, problem list and allergies were reviewed in detail with the patient  Goals were established with regard to weight loss, exercise, and  diet in compliance with medications. He is no longer going to the gym but tries to eat healthy. He is cooking at home more.   Wt Readings from Last 3 Encounters:  01/05/19 261 lb (118.4 kg)  07/26/18 247 lb 3.2 oz (112.1 kg)  11/04/17 242 lb (109.8 kg)   End of life planning was discussed.  Review of Systems  Constitutional: Negative.   HENT: Negative.   Eyes: Negative.   Respiratory: Negative.   Cardiovascular: Negative.   Gastrointestinal: Negative.   Endocrine: Negative.   Genitourinary: Negative.   Musculoskeletal: Negative.   Skin: Negative.   Allergic/Immunologic: Negative.   Neurological: Negative.   Hematological: Negative.   Psychiatric/Behavioral: Negative.   All other systems reviewed and are negative.  Past Medical History:  Diagnosis Date  . Chicken pox   . Frequent headaches   . GERD (gastroesophageal reflux disease)   . Migraines     Social History   Socioeconomic History  .  Marital status: Single    Spouse name: Not on file  . Number of children: Not on file  . Years of education: Not on file  . Highest education level: Not on file  Occupational History  . Not on file  Social Needs  . Financial resource strain: Not on file  . Food insecurity:    Worry: Not on file    Inability: Not on file  . Transportation needs:    Medical: Not on file    Non-medical: Not on file  Tobacco Use  . Smoking status: Never Smoker  . Smokeless tobacco: Never Used  Substance and Sexual Activity  . Alcohol use: Yes    Alcohol/week: 0.0 standard drinks    Comment: socailly   . Drug use: No  . Sexual activity: Not on file  Lifestyle  . Physical activity:    Days per week: Not on file    Minutes per session: Not on file  . Stress: Not on file  Relationships  . Social connections:    Talks on phone: Not on file    Gets together: Not on file    Attends religious service: Not on file    Active member of club or organization: Not on file    Attends meetings of clubs or organizations: Not on file    Relationship status: Not on file  . Intimate partner violence:    Fear of current or ex partner: Not on file    Emotionally abused: Not on file    Physically abused: Not on file  Forced sexual activity: Not on file  Other Topics Concern  . 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    Not married    History reviewed. No pertinent surgical history.  Family History  Problem Relation Age of Onset  . Cancer Father        lung cancer    No Known Allergies  Current Outpatient Medications on File Prior to Visit  Medication Sig Dispense Refill  . aspirin-acetaminophen-caffeine (EXCEDRIN EXTRA STRENGTH) 250-250-65 MG tablet Take 1 tablet by mouth every 6 (six) hours as needed for headache or migraine.    . ondansetron (ZOFRAN) 4 MG tablet Take 1 tablet (4 mg total) by mouth every 8 (eight) hours as needed for nausea or vomiting. 20 tablet 1    . sertraline (ZOLOFT) 50 MG tablet Take 1 tablet (50 mg total) by mouth daily. 30 tablet 0   No current facility-administered medications on file prior to visit.     BP 112/72   Temp 98.2 F (36.8 C)   Ht 6\' 1"  (1.854 m)   Wt 261 lb (118.4 kg)   BMI 34.43 kg/m       Objective:   Physical Exam Vitals signs and nursing note reviewed.  Constitutional:      General: He is not in acute distress.    Appearance: Normal appearance. He is well-developed. He is obese.  HENT:     Head: Normocephalic and atraumatic.     Right Ear: Tympanic membrane, ear canal and external ear normal. There is no impacted cerumen.     Left Ear: Tympanic membrane, ear canal and external ear normal. There is no impacted cerumen.     Nose: Nose normal. No congestion or rhinorrhea.     Mouth/Throat:     Mouth: Mucous membranes are moist.     Pharynx: Oropharynx is clear. No oropharyngeal exudate.  Eyes:     General: No scleral icterus.       Right eye: No discharge.        Left eye: No discharge.     Extraocular Movements: Extraocular movements intact.     Conjunctiva/sclera: Conjunctivae normal.     Pupils: Pupils are equal, round, and reactive to light.  Neck:     Musculoskeletal: Normal range of motion and neck supple.     Vascular: No carotid bruit.     Trachea: No tracheal deviation.  Cardiovascular:     Rate and Rhythm: Normal rate and regular rhythm.     Heart sounds: Normal heart sounds. No murmur. No friction rub. No gallop.   Pulmonary:     Effort: Pulmonary effort is normal. No respiratory distress.     Breath sounds: Normal breath sounds. No stridor. No wheezing, rhonchi or rales.  Chest:     Chest wall: No tenderness.  Abdominal:     General: Bowel sounds are normal. There is no distension.     Palpations: Abdomen is soft. There is no mass.     Tenderness: There is no abdominal tenderness. There is no right CVA tenderness, left CVA tenderness, guarding or rebound.     Hernia: No  hernia is present.  Musculoskeletal: Normal range of motion.        General: No swelling, tenderness, deformity or signs of injury.     Right lower leg: No edema.     Left lower leg: No edema.  Lymphadenopathy:     Cervical: No cervical adenopathy.  Skin:  General: Skin is warm and dry.     Capillary Refill: Capillary refill takes less than 2 seconds.     Coloration: Skin is not jaundiced or pale.     Findings: No bruising, erythema, lesion or rash.  Neurological:     General: No focal deficit present.     Mental Status: He is alert and oriented to person, place, and time. Mental status is at baseline.     Cranial Nerves: No cranial nerve deficit.     Sensory: No sensory deficit.     Motor: No weakness.     Coordination: Coordination normal.     Gait: Gait normal.     Deep Tendon Reflexes: Reflexes normal.  Psychiatric:        Mood and Affect: Mood normal.        Behavior: Behavior normal.        Thought Content: Thought content normal.        Judgment: Judgment normal.       Assessment & Plan:  1. Routine general medical examination at a health care facility - Weight gain of 14 pounds over the last 5 months. Needs to get back into the gym and continue to work on heart healthy diet.  - Follow up in one year or sooner if needed  - CBC with Differential/Platelet - Comprehensive metabolic panel - Lipid panel - TSH - Hemoglobin A1c  2. Anxiety  - sertraline (ZOLOFT) 50 MG tablet; Take 1 tablet (50 mg total) by mouth daily.  Dispense: 90 tablet; Refill: 1  3. Migraine with aura and with status migrainosus, not intractable  - sertraline (ZOLOFT) 50 MG tablet; Take 1 tablet (50 mg total) by mouth daily.  Dispense: 90 tablet; Refill: 1  4. Need for influenza vaccination  - Flu Vaccine QUAD 6+ mos PF IM (Fluarix Quad PF)  Shirline Frees

## 2019-01-05 NOTE — Patient Instructions (Signed)
It was great seeing you today and I am happy to hear that everything is going well.   We will follow up with you regarding your blood work   All your medications have been sent in   Please work on weight loss over the next year

## 2019-02-06 ENCOUNTER — Other Ambulatory Visit: Payer: Self-pay | Admitting: Adult Health

## 2019-02-06 DIAGNOSIS — F419 Anxiety disorder, unspecified: Secondary | ICD-10-CM

## 2019-02-06 DIAGNOSIS — G43101 Migraine with aura, not intractable, with status migrainosus: Secondary | ICD-10-CM

## 2019-03-16 ENCOUNTER — Encounter: Payer: Self-pay | Admitting: Adult Health

## 2019-03-17 ENCOUNTER — Encounter: Payer: Self-pay | Admitting: Adult Health

## 2019-03-17 ENCOUNTER — Ambulatory Visit (INDEPENDENT_AMBULATORY_CARE_PROVIDER_SITE_OTHER): Payer: BC Managed Care – PPO | Admitting: Adult Health

## 2019-03-17 ENCOUNTER — Other Ambulatory Visit: Payer: Self-pay

## 2019-03-17 DIAGNOSIS — T148XXA Other injury of unspecified body region, initial encounter: Secondary | ICD-10-CM | POA: Diagnosis not present

## 2019-03-17 MED ORDER — NAPROXEN 500 MG PO TABS: 500.0000 mg | ORAL_TABLET | Freq: Two times a day (BID) | ORAL | 0 refills | Status: DC

## 2019-03-17 NOTE — Progress Notes (Signed)
Virtual Visit via Video Note  I connected with Riley Davis on 03/17/19 at 10:30 AM EDT by a video enabled telemedicine application and verified that I am speaking with the correct person using two identifiers.  Location patient: home Location provider:work or home office Persons participating in the virtual visit: patient, provider  I discussed the limitations of evaluation and management by telemedicine and the availability of in person appointments. The patient expressed understanding and agreed to proceed.   HPI: 28 year old male who is being evaluated today for right arm pain.  Has been present for 3 days and is described as a "stinging".  He has not been using anything over-the-counter to help with his discomfort.  Pain is worse with extension and flexion but is relieved with keeping his arm straight.  He has no loss of range of motion.  Pain is become intermittent.  Only aggravating factor that he can think of was pushing a heavy bicycle up his stairs at his apartment.  He denies any shoulder pain or numbness and tingling.  Pain is located in the bicep and on the inside of his right elbow.  He has not noticed any bruising, redness, warmth, or swelling.  He used icy hot which helped relieve the pain for short duration.   ROS: See pertinent positives and negatives per HPI.  Past Medical History:  Diagnosis Date  . Chicken pox   . Frequent headaches   . GERD (gastroesophageal reflux disease)   . Migraines     No past surgical history on file.  Family History  Problem Relation Age of Onset  . Cancer Father        lung cancer      Current Outpatient Medications:  .  aspirin-acetaminophen-caffeine (EXCEDRIN EXTRA STRENGTH) 250-250-65 MG tablet, Take 1 tablet by mouth every 6 (six) hours as needed for headache or migraine., Disp: , Rfl:  .  naproxen (NAPROSYN) 500 MG tablet, Take 1 tablet (500 mg total) by mouth 2 (two) times daily with a meal., Disp: 30 tablet, Rfl: 0 .   ondansetron (ZOFRAN) 4 MG tablet, Take 1 tablet (4 mg total) by mouth every 8 (eight) hours as needed for nausea or vomiting., Disp: 20 tablet, Rfl: 1 .  sertraline (ZOLOFT) 50 MG tablet, TAKE 1 TABLET(50 MG) BY MOUTH DAILY, Disp: 90 tablet, Rfl: 1  EXAM:  VITALS per patient if applicable:  GENERAL: alert, oriented, appears well and in no acute distress  HEENT: atraumatic, conjunttiva clear, no obvious abnormalities on inspection of external nose and ears  NECK: normal movements of the head and neck  LUNGS: on inspection no signs of respiratory distress, breathing rate appears normal, no obvious gross SOB, gasping or wheezing  CV: no obvious cyanosis  MS: moves all visible extremities without noticeable abnormality.  No bruising, redness, or swelling noted.  PSYCH/NEURO: pleasant and cooperative, no obvious depression or anxiety, speech and thought processing grossly intact  ASSESSMENT AND PLAN:  Discussed the following assessment and plan: Sam consistent with muscle strain.  No concern for muscle tear or ligament tear.  Advised anti-inflammatory and will send in naproxen 500 mg twice daily as well as heat or ice, whichever feels better.  Continue with stretching exercises.  Return precautions reviewed  Muscle strain - Plan: naproxen (NAPROSYN) 500 MG tablet     I discussed the assessment and treatment plan with the patient. The patient was provided an opportunity to ask questions and all were answered. The patient agreed with the plan and  demonstrated an understanding of the instructions.   The patient was advised to call back or seek an in-person evaluation if the symptoms worsen or if the condition fails to improve as anticipated.   Shirline Freesory Wateen Varon, NP

## 2019-03-26 ENCOUNTER — Encounter: Payer: Self-pay | Admitting: Adult Health

## 2019-12-10 ENCOUNTER — Encounter: Payer: Self-pay | Admitting: Adult Health

## 2019-12-12 ENCOUNTER — Ambulatory Visit (INDEPENDENT_AMBULATORY_CARE_PROVIDER_SITE_OTHER)
Admission: RE | Admit: 2019-12-12 | Discharge: 2019-12-12 | Disposition: A | Discharge status: Home / Self Care | Payer: BC Managed Care – PPO | Source: Ambulatory Visit

## 2019-12-12 DIAGNOSIS — K1370 Unspecified lesions of oral mucosa: Secondary | ICD-10-CM | POA: Diagnosis not present

## 2019-12-12 MED ORDER — LIDOCAINE VISCOUS HCL 2 % MT SOLN: 15.0000 mL | Freq: Four times a day (QID) | OROMUCOSAL | 0 refills | Status: DC | PRN

## 2019-12-12 MED ORDER — ONDANSETRON 4 MG PO TBDP: 4.0000 mg | ORAL_TABLET | Freq: Three times a day (TID) | ORAL | 0 refills | Status: DC | PRN

## 2019-12-12 NOTE — Discharge Instructions (Addendum)
May use zofran for nausea as needed every 6-8 hours Try lidocaine to help with discomfort Follow up in person to better see what is going on in mouth if persisting

## 2019-12-12 NOTE — ED Provider Notes (Signed)
Virtual Visit via Video Note:  Riley Davis  initiated request for Telemedicine visit with [facility] team. I connected with Riley Davis  on 12/12/2019 at 3:10 PM  for a synchronized telemedicine visit using a video enabled HIPPA compliant telemedicine application. I verified that I am speaking with Riley Davis  using two identifiers. Ralph Brouwer C Lincoln Kleiner, PA-C  was physically located in a [facility]gent care site and Shae Augello Greff was located at a different location.   The limitations of evaluation and management by telemedicine as well as the availability of in-person appointments were discussed. Patient was informed that he  may incur a bill ( including co-pay) for this virtual visit encounter. Riley Davis  expressed understanding and gave verbal consent to proceed with virtual visit.     History of Present Illness:Riley Davis  is a 29 y.o. male presents for evaluation of mouth pain.  Patient states that over the past week he has had an irritation sensation in the back of his mouth.  He denies any sore throat or pain with swallowing.  He has been unable to notice any specific lesions or rash in his mouth.  He has had associated nausea as well as fatigue.  2 episodes of vomiting earlier in the week, but this not been persistent.  Denies any URI symptoms of congestion, rhinorrhea or cough.    No Known Allergies   Past Medical History:  Diagnosis Date  . Chicken pox   . Frequent headaches   . GERD (gastroesophageal reflux disease)   . Migraines      Social History   Tobacco Use  . Smoking status: Never Smoker  . Smokeless tobacco: Never Used  Substance Use Topics  . Alcohol use: Yes    Alcohol/week: 0.0 standard drinks    Comment: socailly   . Drug use: No        Observations/Objective: Physical Exam  Constitutional: He is oriented to person, place, and time and well-developed, well-nourished, and in no distress. No distress.   HENT:  Head: Normocephalic and atraumatic.  Unable to visualize area and throat that is irritated, tongue appears pink No facial swelling  Pulmonary/Chest: Effort normal. No respiratory distress.  Speaking full sentences, no respiratory distress  Neurological: He is alert and oriented to person, place, and time.  Speech clear, face symmetric     Assessment and Plan:    ICD-10-CM   1. Mouth lesion  K13.70     Unclear cause of irritation, seems less likely pharyngitis/tonsillitis given description of pain and source of pain.  Possible apthous ulcer, discussed with patient given unable to visualize unable to fully explain discomfort.  No airway compromise at this time, will treat symptomatically with Zofran for nausea and viscous lidocaine to help with discomfort.  Advised if not seeing any improvement daily for the next couple of days to follow-up in person or developing any swelling for better visualization of mouth/throat.  Discussed strict return precautions. Patient verbalized understanding and is agreeable with plan.    Follow Up Instructions:     I discussed the assessment and treatment plan with the patient. The patient was provided an opportunity to ask questions and all were answered. The patient agreed with the plan and demonstrated an understanding of the instructions.   The patient was advised to call back or seek an in-person evaluation if the symptoms worsen or if the condition fails to improve as anticipated.  Lew Dawes, PA-C  12/12/2019 3:10 PM         Lew Dawes, PA-C 12/12/19 1512

## 2019-12-14 NOTE — Telephone Encounter (Signed)
Pt went to the ED 

## 2020-02-01 ENCOUNTER — Other Ambulatory Visit: Payer: Self-pay | Admitting: Adult Health

## 2020-02-01 DIAGNOSIS — G43101 Migraine with aura, not intractable, with status migrainosus: Secondary | ICD-10-CM

## 2020-02-01 DIAGNOSIS — F419 Anxiety disorder, unspecified: Secondary | ICD-10-CM

## 2020-02-01 NOTE — Telephone Encounter (Signed)
30 DAY SUPPLY SENT TO THE PHARMACY BY E-SCRIBE.  PT IS DUE FOR YEARLY PHYSICAL.

## 2020-06-01 ENCOUNTER — Ambulatory Visit: Payer: BC Managed Care – PPO | Admitting: Adult Health

## 2020-06-01 ENCOUNTER — Other Ambulatory Visit: Payer: Self-pay

## 2020-06-01 ENCOUNTER — Encounter: Payer: Self-pay | Admitting: Adult Health

## 2020-06-01 DIAGNOSIS — M545 Low back pain, unspecified: Secondary | ICD-10-CM

## 2020-06-01 DIAGNOSIS — F419 Anxiety disorder, unspecified: Secondary | ICD-10-CM

## 2020-06-01 MED ORDER — ONDANSETRON 4 MG PO TBDP: 4.0000 mg | ORAL_TABLET | Freq: Three times a day (TID) | ORAL | 1 refills | Status: DC | PRN

## 2020-06-01 MED ORDER — SERTRALINE HCL 50 MG PO TABS: ORAL_TABLET | ORAL | 0 refills | Status: DC

## 2020-06-01 NOTE — Progress Notes (Signed)
Subjective:    Patient ID: Riley Davis, male    DOB: 21-Mar-1991, 29 y.o.   MRN: [id number]  HPI  29 year old male who  has a past medical history of Chicken pox, Frequent headaches, GERD (gastroesophageal reflux disease), and Migraines.  He presents to the office today for low back pain that has been present for 2-3 days. Pain is improving but continues to be sore. He reports no trauma or aggravating injury. Reports that when he would put weight on his right foot it would cause him to develop a momentary sharp pain in his low back. Denies radiating pain or issues with bowel or bladder.   He also needs his anxiety medication refilled. Anxiety is controlled.   Review of Systems See HPI   Past Medical History:  Diagnosis Date  . Chicken pox   . Frequent headaches   . GERD (gastroesophageal reflux disease)   . Migraines     Social History   Socioeconomic History  . Marital status: Single    Spouse name: Not on file  . Number of children: Not on file  . Years of education: Not on file  . Highest education level: Not on file  Occupational History  . Not on file  Tobacco Use  . Smoking status: Never Smoker  . Smokeless tobacco: Never Used  Substance and Sexual Activity  . Alcohol use: Yes    Alcohol/week: 0.0 standard drinks    Comment: socailly   . Drug use: No  . Sexual activity: Not on file  Other Topics Concern  . 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    Not married   Social Determinants of Corporate investment banker Strain:   . Difficulty of Paying Living Expenses:   Food Insecurity:   . Worried About Programme researcher, broadcasting/film/video in the Last Year:   . Barista in the Last Year:   Transportation Needs:   . Freight forwarder (Medical):   Marland Kitchen Lack of Transportation (Non-Medical):   Physical Activity:   . Days of Exercise per Week:   . Minutes of Exercise per Session:   Stress:   . Feeling of Stress :   Social  Connections:   . Frequency of Communication with Friends and Family:   . Frequency of Social Gatherings with Friends and Family:   . Attends Religious Services:   . Active Member of Clubs or Organizations:   . Attends Banker Meetings:   Marland Kitchen Marital Status:   Intimate Partner Violence:   . Fear of Current or Ex-Partner:   . Emotionally Abused:   Marland Kitchen Physically Abused:   . Sexually Abused:     History reviewed. No pertinent surgical history.  Family History  Problem Relation Age of Onset  . Cancer Father        lung cancer    No Known Allergies  Current Outpatient Medications on File Prior to Visit  Medication Sig Dispense Refill  . aspirin-acetaminophen-caffeine (EXCEDRIN EXTRA STRENGTH) 250-250-65 MG tablet Take 1 tablet by mouth every 6 (six) hours as needed for headache or migraine.    . ondansetron (ZOFRAN ODT) 4 MG disintegrating tablet Take 1 tablet (4 mg total) by mouth every 8 (eight) hours as needed for nausea or vomiting. (Patient not taking: Reported on 06/01/2020) 20 tablet 0  . sertraline (ZOLOFT) 50 MG tablet Take 1 tablet by mouth daily. **DUE FOR YEARLY PHYSICAL** (  Patient not taking: Reported on 06/01/2020) 30 tablet 0   No current facility-administered medications on file prior to visit.    BP (!) 122/92   Temp 97.9 F (36.6 C)   Wt 276 lb (125.2 kg)   BMI 36.41 kg/m       Objective:   Physical Exam Vitals and nursing note reviewed.  Constitutional:      Appearance: Normal appearance.  Musculoskeletal:        General: Tenderness present. No swelling. Normal range of motion.     Comments: Has mild tenderness with palpation to lower left Paraspinous muscles.    Skin:    General: Skin is warm and dry.  Neurological:     General: No focal deficit present.     Mental Status: He is oriented to person, place, and time.       Assessment & Plan:  1. Acute left-sided low back pain without sciatica - Advised conservative measures. Motrin,   Gentle stretching exercises, warm compresses.  - Follow up as needed  2. Anxiety  - ondansetron (ZOFRAN ODT) 4 MG disintegrating tablet; Take 1 tablet (4 mg total) by mouth every 8 (eight) hours as needed for nausea or vomiting.  Dispense: 20 tablet; Refill: 1 - sertraline (ZOLOFT) 50 MG tablet; Take 1 tablet by mouth daily. **DUE FOR YEARLY PHYSICAL**  Dispense: 90 tablet; Refill: 0  Shirline Frees, NP

## 2020-06-26 ENCOUNTER — Encounter: Payer: BC Managed Care – PPO | Admitting: Adult Health

## 2020-08-30 NOTE — Progress Notes (Deleted)
Subjective:    Patient ID: Riley Davis, male    DOB: 1991-10-24, 29 y.o.   MRN: [id number]  HPI Patient presents for yearly preventative medicine examination. He is a pleasant 29 year old male who  has a past medical history of Chicken pox, Frequent headaches, GERD (gastroesophageal reflux disease), and Migraines.   Anxiety - feels well controlled on Zoloft 50 mg daily.   Migraines    All immunizations and health maintenance protocols were reviewed with the patient and needed orders were placed.  Appropriate screening laboratory values were ordered for the patient including screening of hyperlipidemia, renal function and hepatic function.   Medication reconciliation,  past medical history, social history, problem list and allergies were reviewed in detail with the patient  Goals were established with regard to weight loss, exercise, and  diet in compliance with medications  Wt Readings from Last 10 Encounters:  06/01/20 276 lb (125.2 kg)  01/05/19 261 lb (118.4 kg)  07/26/18 247 lb 3.2 oz (112.1 kg)  11/04/17 242 lb (109.8 kg)  10/08/17 239 lb (108.4 kg)  06/25/17 239 lb (108.4 kg)  03/11/17 239 lb 6.4 oz (108.6 kg)  02/18/17 240 lb (108.9 kg)  08/02/16 228 lb (103.4 kg)  03/06/16 222 lb (100.7 kg)  ] Review of Systems  Constitutional: Negative.   HENT: Negative.   Eyes: Negative.   Respiratory: Negative.   Cardiovascular: Negative.   Gastrointestinal: Negative.   Endocrine: Negative.   Genitourinary: Negative.   Musculoskeletal: Negative.   Skin: Negative.   Allergic/Immunologic: Negative.   Neurological: Negative.   Hematological: Negative.   Psychiatric/Behavioral: Negative.   All other systems reviewed and are negative.  Past Medical History:  Diagnosis Date  . Chicken pox   . Frequent headaches   . GERD (gastroesophageal reflux disease)   . Migraines     Social History   Socioeconomic History  . Marital status: Single    Spouse name: Not on  file  . Number of children: Not on file  . Years of education: Not on file  . Highest education level: Not on file  Occupational History  . Not on file  Tobacco Use  . Smoking status: Never Smoker  . Smokeless tobacco: Never Used  Substance and Sexual Activity  . Alcohol use: Yes    Alcohol/week: 0.0 standard drinks    Comment: socailly   . Drug use: No  . Sexual activity: Not on file  Other Topics Concern  . 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    Not married   Social Determinants of Corporate investment banker Strain:   . Difficulty of Paying Living Expenses: Not on file  Food Insecurity:   . Worried About Programme researcher, broadcasting/film/video in the Last Year: Not on file  . Ran Out of Food in the Last Year: Not on file  Transportation Needs:   . Lack of Transportation (Medical): Not on file  . Lack of Transportation (Non-Medical): Not on file  Physical Activity:   . Days of Exercise per Week: Not on file  . Minutes of Exercise per Session: Not on file  Stress:   . Feeling of Stress : Not on file  Social Connections:   . Frequency of Communication with Friends and Family: Not on file  . Frequency of Social Gatherings with Friends and Family: Not on file  . Attends Religious Services: Not on file  . Active Member  of Clubs or Organizations: Not on file  . Attends Banker Meetings: Not on file  . Marital Status: Not on file  Intimate Partner Violence:   . Fear of Current or Ex-Partner: Not on file  . Emotionally Abused: Not on file  . Physically Abused: Not on file  . Sexually Abused: Not on file    No past surgical history on file.  Family History  Problem Relation Age of Onset  . Cancer Father        lung cancer    No Known Allergies  Current Outpatient Medications on File Prior to Visit  Medication Sig Dispense Refill  . aspirin-acetaminophen-caffeine (EXCEDRIN EXTRA STRENGTH) 250-250-65 MG tablet Take 1 tablet by mouth every  6 (six) hours as needed for headache or migraine.    . ondansetron (ZOFRAN ODT) 4 MG disintegrating tablet Take 1 tablet (4 mg total) by mouth every 8 (eight) hours as needed for nausea or vomiting. 20 tablet 1  . sertraline (ZOLOFT) 50 MG tablet Take 1 tablet by mouth daily. **DUE FOR YEARLY PHYSICAL** 90 tablet 0   No current facility-administered medications on file prior to visit.    There were no vitals taken for this visit.      Objective:   Physical Exam Vitals and nursing note reviewed.  Constitutional:      General: He is not in acute distress.    Appearance: Normal appearance. He is well-developed and normal weight.  HENT:     Head: Normocephalic and atraumatic.     Right Ear: Tympanic membrane, ear canal and external ear normal. There is no impacted cerumen.     Left Ear: Tympanic membrane, ear canal and external ear normal. There is no impacted cerumen.     Nose: Nose normal. No congestion or rhinorrhea.     Mouth/Throat:     Mouth: Mucous membranes are moist.     Pharynx: Oropharynx is clear. No oropharyngeal exudate or posterior oropharyngeal erythema.  Eyes:     General:        Right eye: No discharge.        Left eye: No discharge.     Extraocular Movements: Extraocular movements intact.     Conjunctiva/sclera: Conjunctivae normal.     Pupils: Pupils are equal, round, and reactive to light.  Neck:     Vascular: No carotid bruit.     Trachea: No tracheal deviation.  Cardiovascular:     Rate and Rhythm: Normal rate and regular rhythm.     Pulses: Normal pulses.     Heart sounds: Normal heart sounds. No murmur heard.  No friction rub. No gallop.   Pulmonary:     Effort: Pulmonary effort is normal. No respiratory distress.     Breath sounds: Normal breath sounds. No stridor. No wheezing, rhonchi or rales.  Chest:     Chest wall: No tenderness.  Abdominal:     General: Bowel sounds are normal. There is no distension.     Palpations: Abdomen is soft. There is  no mass.     Tenderness: There is no abdominal tenderness. There is no right CVA tenderness, left CVA tenderness, guarding or rebound.     Hernia: No hernia is present.  Musculoskeletal:        General: No swelling, tenderness, deformity or signs of injury. Normal range of motion.     Right lower leg: No edema.     Left lower leg: No edema.  Lymphadenopathy:  Cervical: No cervical adenopathy.  Skin:    General: Skin is warm and dry.     Capillary Refill: Capillary refill takes less than 2 seconds.     Coloration: Skin is not jaundiced or pale.     Findings: No bruising, erythema, lesion or rash.  Neurological:     General: No focal deficit present.     Mental Status: He is alert and oriented to person, place, and time.     Cranial Nerves: No cranial nerve deficit.     Sensory: No sensory deficit.     Motor: No weakness.     Coordination: Coordination normal.     Gait: Gait normal.     Deep Tendon Reflexes: Reflexes normal.  Psychiatric:        Mood and Affect: Mood normal.        Behavior: Behavior normal.        Thought Content: Thought content normal.        Judgment: Judgment normal.       Assessment & Plan:

## 2020-08-31 ENCOUNTER — Encounter: Payer: BC Managed Care – PPO | Admitting: Adult Health

## 2020-08-31 DIAGNOSIS — F419 Anxiety disorder, unspecified: Secondary | ICD-10-CM

## 2020-08-31 DIAGNOSIS — Z0000 Encounter for general adult medical examination without abnormal findings: Secondary | ICD-10-CM

## 2020-08-31 DIAGNOSIS — G43009 Migraine without aura, not intractable, without status migrainosus: Secondary | ICD-10-CM

## 2020-08-31 DIAGNOSIS — E668 Other obesity: Secondary | ICD-10-CM

## 2021-01-10 ENCOUNTER — Encounter: Payer: Self-pay | Admitting: Adult Health

## 2021-01-29 ENCOUNTER — Encounter: Payer: Self-pay | Admitting: Adult Health

## 2021-02-06 ENCOUNTER — Encounter: Payer: Self-pay | Admitting: Adult Health

## 2021-02-07 ENCOUNTER — Other Ambulatory Visit: Payer: Self-pay

## 2021-02-07 ENCOUNTER — Encounter: Payer: Self-pay | Admitting: Adult Health

## 2021-02-08 ENCOUNTER — Encounter: Payer: Self-pay | Admitting: Adult Health

## 2021-02-08 ENCOUNTER — Ambulatory Visit (INDEPENDENT_AMBULATORY_CARE_PROVIDER_SITE_OTHER): Payer: Self-pay | Admitting: Adult Health

## 2021-02-08 VITALS — BP 130/76 | HR 82 | Temp 98.6°F | Wt 279.8 lb

## 2021-02-08 DIAGNOSIS — G43101 Migraine with aura, not intractable, with status migrainosus: Secondary | ICD-10-CM

## 2021-02-08 DIAGNOSIS — F419 Anxiety disorder, unspecified: Secondary | ICD-10-CM

## 2021-02-08 MED ORDER — SERTRALINE HCL 50 MG PO TABS: ORAL_TABLET | ORAL | 1 refills | Status: DC

## 2021-02-08 MED ORDER — ONDANSETRON 4 MG PO TBDP: 4.0000 mg | ORAL_TABLET | Freq: Three times a day (TID) | ORAL | 1 refills | Status: DC | PRN

## 2021-02-08 NOTE — Progress Notes (Signed)
Subjective:    Patient ID: Riley Davis, male    DOB: [birth year], 30 y.o.   MRN: [id number]  HPI 30 year old male who  has a past medical history of Chicken pox, Frequent headaches, GERD (gastroesophageal reflux disease), and Migraines.  He presents to the clinic today for follow up regarding migraine headaches.  The past his migraines have been controlled controlling his anxiety with Zoloft.    Unortunately, he has been having more frequent migraines above his right eye that seem to be lasting longer than normal and experiencing auras with intermittent nausea These migraines can last up to 2 to 3 days and has been happening over the last 2 weeks.  He has been out of Zoloft for close to 6 months, lost the prescription when he moved and never restarted the medication.  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  He would like to get back on his Zoloft to help both his migraines and anxiety  Review of Systems See HPI   Past Medical History:  Diagnosis Date  . Chicken pox   . Frequent headaches   . GERD (gastroesophageal reflux disease)   . Migraines     Social History   Socioeconomic History  . Marital status: Single    Spouse name: Not on file  . Number of children: Not on file  . Years of education: Not on file  . Highest education level: Not on file  Occupational History  . Not on file  Tobacco Use  . Smoking status: Never Smoker  . Smokeless tobacco: Never Used  Substance and Sexual Activity  . Alcohol use: Yes    Alcohol/week: 0.0 standard drinks    Comment: socailly   . Drug use: No  . Sexual activity: Not on file  Other Topics Concern  . 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    Not married   Social Determinants of Corporate investment banker Strain: Not on file  Food Insecurity: Not on file  Transportation Needs: Not on file  Physical  Activity: Not on file  Stress: Not on file  Social Connections: Not on file  Intimate Partner Violence: Not on file    No past surgical history on file.  Family History  Problem Relation Age of Onset  . Cancer Father        lung cancer    No Known Allergies  Current Outpatient Medications on File Prior to Visit  Medication Sig Dispense Refill  . aspirin-acetaminophen-caffeine (EXCEDRIN MIGRAINE) 250-250-65 MG tablet Take 1 tablet by mouth every 6 (six) hours as needed for headache or migraine.    . ondansetron (ZOFRAN ODT) 4 MG disintegrating tablet Take 1 tablet (4 mg total) by mouth every 8 (eight) hours as needed for nausea or vomiting. 20 tablet 1  . sertraline (ZOLOFT) 50 MG tablet Take 1 tablet by mouth daily. **DUE FOR YEARLY PHYSICAL** 90 tablet 0   No current facility-administered medications on file prior to visit.    BP 130/76 (BP Location: Left Arm, Patient Position: Sitting, Cuff Size: Normal)   Pulse 82   Temp 98.6 F (37 C) (Oral)   Wt 279 lb 12.8 oz (126.9 kg)   SpO2 95%   BMI 36.92 kg/m       Objective:   Physical Exam Vitals and nursing note reviewed.  Constitutional:  Appearance: Normal appearance.  Cardiovascular:     Rate and Rhythm: Normal rate and regular rhythm.     Pulses: Normal pulses.     Heart sounds: Normal heart sounds.  Pulmonary:     Effort: Pulmonary effort is normal.     Breath sounds: Normal breath sounds.  Skin:    General: Skin is warm.     Capillary Refill: Capillary refill takes less than 2 seconds.  Neurological:     General: No focal deficit present.     Mental Status: He is alert and oriented to person, place, and time.  Psychiatric:        Mood and Affect: Mood normal.        Behavior: Behavior normal.        Thought Content: Thought content normal.        Judgment: Judgment normal.       Assessment & Plan:  1. Anxiety  - sertraline (ZOLOFT) 50 MG tablet; Take 1 tablet by mouth daily.  Dispense: 90  tablet; Refill: 1 - ondansetron (ZOFRAN ODT) 4 MG disintegrating tablet; Take 1 tablet (4 mg total) by mouth every 8 (eight) hours as needed for nausea or vomiting.  Dispense: 20 tablet; Refill: 1  2. Migraine with aura and with status migrainosus, not intractable  - sertraline (ZOLOFT) 50 MG tablet; Take 1 tablet by mouth daily.  Dispense: 90 tablet; Refill: 1 - ondansetron (ZOFRAN ODT) 4 MG disintegrating tablet; Take 1 tablet (4 mg total) by mouth every 8 (eight) hours as needed for nausea or vomiting.  Dispense: 20 tablet; Refill: 1  Shirline Frees, NP

## 2021-05-29 ENCOUNTER — Encounter: Payer: Self-pay | Admitting: Adult Health

## 2021-05-29 ENCOUNTER — Other Ambulatory Visit: Payer: Self-pay

## 2021-05-29 ENCOUNTER — Ambulatory Visit (INDEPENDENT_AMBULATORY_CARE_PROVIDER_SITE_OTHER): Payer: BC Managed Care – PPO | Admitting: Adult Health

## 2021-05-29 VITALS — BP 128/80 | HR 91 | Temp 96.8°F | Ht 73.5 in | Wt 278.6 lb

## 2021-05-29 DIAGNOSIS — Z0001 Encounter for general adult medical examination with abnormal findings: Secondary | ICD-10-CM

## 2021-05-29 DIAGNOSIS — F419 Anxiety disorder, unspecified: Secondary | ICD-10-CM

## 2021-05-29 DIAGNOSIS — G43101 Migraine with aura, not intractable, with status migrainosus: Secondary | ICD-10-CM | POA: Diagnosis not present

## 2021-05-29 DIAGNOSIS — E663 Overweight: Secondary | ICD-10-CM | POA: Diagnosis not present

## 2021-05-29 DIAGNOSIS — Z1159 Encounter for screening for other viral diseases: Secondary | ICD-10-CM

## 2021-05-29 LAB — CBC WITH DIFFERENTIAL/PLATELET
Basophils Absolute: 0.1 10*3/uL (ref 0.0–0.1)
Basophils Relative: 0.8 % (ref 0.0–3.0)
Eosinophils Absolute: 0.5 10*3/uL (ref 0.0–0.7)
Eosinophils Relative: 5.6 % — ABNORMAL HIGH (ref 0.0–5.0)
HCT: 46 % (ref 39.0–52.0)
Hemoglobin: 15.5 g/dL (ref 13.0–17.0)
Lymphocytes Relative: 24.5 % (ref 12.0–46.0)
Lymphs Abs: 2 10*3/uL (ref 0.7–4.0)
MCHC: 33.7 g/dL (ref 30.0–36.0)
MCV: 88.1 fl (ref 78.0–100.0)
Monocytes Absolute: 0.5 10*3/uL (ref 0.1–1.0)
Monocytes Relative: 6.4 % (ref 3.0–12.0)
Neutro Abs: 5.2 10*3/uL (ref 1.4–7.7)
Neutrophils Relative %: 62.7 % (ref 43.0–77.0)
Platelets: 327 10*3/uL (ref 150.0–400.0)
RBC: 5.22 Mil/uL (ref 4.22–5.81)
RDW: 14 % (ref 11.5–15.5)
WBC: 8.2 10*3/uL (ref 4.0–10.5)

## 2021-05-29 LAB — COMPREHENSIVE METABOLIC PANEL
ALT: 31 U/L (ref 0–53)
AST: 16 U/L (ref 0–37)
Albumin: 4.4 g/dL (ref 3.5–5.2)
Alkaline Phosphatase: 70 U/L (ref 39–117)
BUN: 11 mg/dL (ref 6–23)
CO2: 28 mEq/L (ref 19–32)
Calcium: 9.4 mg/dL (ref 8.4–10.5)
Chloride: 101 mEq/L (ref 96–112)
Creatinine, Ser: 0.87 mg/dL (ref 0.40–1.50)
GFR: 116.66 mL/min (ref >60.00)
Glucose, Bld: 90 mg/dL (ref 70–99)
Potassium: 4 mEq/L (ref 3.5–5.1)
Sodium: 140 mEq/L (ref 135–145)
Total Bilirubin: 0.8 mg/dL (ref 0.2–1.2)
Total Protein: 7.2 g/dL (ref 6.0–8.3)

## 2021-05-29 LAB — HEMOGLOBIN A1C: Hgb A1c MFr Bld: 5.7 % (ref 4.6–6.5)

## 2021-05-29 LAB — TSH: TSH: 3.22 u[IU]/mL (ref 0.35–5.50)

## 2021-05-29 LAB — LIPID PANEL
Cholesterol: 239 mg/dL — ABNORMAL HIGH (ref 0–200)
HDL: 41 mg/dL (ref >39.00)
LDL Cholesterol: 165 mg/dL — ABNORMAL HIGH (ref 0–99)
NonHDL: 198.45
Total CHOL/HDL Ratio: 6
Triglycerides: 168 mg/dL — ABNORMAL HIGH (ref 0.0–149.0)
VLDL: 33.6 mg/dL (ref 0.0–40.0)

## 2021-05-29 NOTE — Progress Notes (Signed)
Subjective:    Patient ID: Riley Davis, male    DOB: Feb 26, 1991, 30 y.o.   MRN: [id number]  HPI Patient presents for yearly preventative medicine examination. He is a pleasant 30 year old male who  has a past medical history of Chicken pox, Frequent headaches, GERD (gastroesophageal reflux disease), and Migraines.  He is finishing up his Masters degree in the next few months and then starts if doctoral program this fall.    Anxiety -prescribed Zoloft 50 mg daily.  Migraine Headaches -controlled with managing stress and anxiety.Has not had any recent migraines   All immunizations and health maintenance protocols were reviewed with the patient and needed orders were placed.  Appropriate screening laboratory values were ordered for the patient including screening of hyperlipidemia, renal function and hepatic function.  Medication reconciliation,  past medical history, social history, problem list and allergies were reviewed in detail with the patient  Goals were established with regard to weight loss, exercise, and  diet in compliance with medications. He is not exercising on a routine basis and he is not eating healthy. He hopes that this will change once he is back to living on campus.   Wt Readings from Last 3 Encounters:  05/29/21 278 lb 9.6 oz (126.4 kg)  02/08/21 279 lb 12.8 oz (126.9 kg)  06/01/20 276 lb (125.2 kg)   Review of Systems  Constitutional: Negative.   HENT: Negative.    Eyes: Negative.   Respiratory: Negative.    Cardiovascular: Negative.   Gastrointestinal: Negative.   Endocrine: Negative.   Genitourinary: Negative.   Musculoskeletal: Negative.   Skin: Negative.   Allergic/Immunologic: Negative.   Neurological:  Positive for headaches.  Hematological: Negative.   Psychiatric/Behavioral:  The patient is nervous/anxious.   All other systems reviewed and are negative. Past Medical History:  Diagnosis Date   Chicken pox    Frequent headaches    GERD  (gastroesophageal reflux disease)    Migraines     Social History   Socioeconomic History   Marital status: Single    Spouse name: Not on file   Number of children: Not on file   Years of education: Not on file   Highest education level: Not on file  Occupational History   Not on file  Tobacco Use   Smoking status: Never   Smokeless tobacco: Never  Substance and Sexual Activity   Alcohol use: Yes    Alcohol/week: 0.0 standard drinks    Comment: socailly    Drug use: No   Sexual activity: Not on file  Other Topics Concern   Not on file  Social History Narrative   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    Not married   Social Determinants of Corporate investment banker Strain: Not on file  Food Insecurity: Not on file  Transportation Needs: Not on file  Physical Activity: Not on file  Stress: Not on file  Social Connections: Not on file  Intimate Partner Violence: Not on file    No past surgical history on file.  Family History  Problem Relation Age of Onset   Cancer Father        lung cancer    No Known Allergies  Current Outpatient Medications on File Prior to Visit  Medication Sig Dispense Refill   aspirin-acetaminophen-caffeine (EXCEDRIN MIGRAINE) 250-250-65 MG tablet Take 1 tablet by mouth every 6 (six) hours as needed for headache or migraine.     ondansetron (ZOFRAN ODT) 4 MG disintegrating  tablet Take 1 tablet (4 mg total) by mouth every 8 (eight) hours as needed for nausea or vomiting. 20 tablet 1   sertraline (ZOLOFT) 50 MG tablet Take 1 tablet by mouth daily. 90 tablet 1   No current facility-administered medications on file prior to visit.    BP 128/80 (BP Location: Left Arm, Patient Position: Sitting, Cuff Size: Normal)   Pulse 91   Temp (!) 96.8 F (36 C) (Temporal)   Ht 6' 1.5" (1.867 m)   Wt 278 lb 9.6 oz (126.4 kg)   SpO2 97%   BMI 36.26 kg/m       Objective:   Physical Exam Vitals and nursing note reviewed.  Constitutional:       General: He is not in acute distress.    Appearance: Normal appearance. He is well-developed and normal weight.  HENT:     Head: Normocephalic and atraumatic.     Right Ear: Tympanic membrane, ear canal and external ear normal. There is no impacted cerumen.     Left Ear: Tympanic membrane, ear canal and external ear normal. There is no impacted cerumen.     Nose: Nose normal. No congestion or rhinorrhea.     Mouth/Throat:     Mouth: Mucous membranes are moist.     Pharynx: Oropharynx is clear. No oropharyngeal exudate or posterior oropharyngeal erythema.  Eyes:     General:        Right eye: No discharge.        Left eye: No discharge.     Extraocular Movements: Extraocular movements intact.     Conjunctiva/sclera: Conjunctivae normal.     Pupils: Pupils are equal, round, and reactive to light.  Neck:     Vascular: No carotid bruit.     Trachea: No tracheal deviation.  Cardiovascular:     Rate and Rhythm: Normal rate and regular rhythm.     Pulses: Normal pulses.     Heart sounds: Normal heart sounds. No murmur heard.   No friction rub. No gallop.  Pulmonary:     Effort: Pulmonary effort is normal. No respiratory distress.     Breath sounds: Normal breath sounds. No stridor. No wheezing, rhonchi or rales.  Chest:     Chest wall: No tenderness.  Abdominal:     General: Bowel sounds are normal. There is no distension.     Palpations: Abdomen is soft. There is no mass.     Tenderness: There is no abdominal tenderness. There is no right CVA tenderness, left CVA tenderness, guarding or rebound.     Hernia: No hernia is present.  Musculoskeletal:        General: No swelling, tenderness, deformity or signs of injury. Normal range of motion.     Right lower leg: No edema.     Left lower leg: No edema.  Lymphadenopathy:     Cervical: No cervical adenopathy.  Skin:    General: Skin is warm and dry.     Capillary Refill: Capillary refill takes less than 2 seconds.     Coloration:  Skin is not jaundiced or pale.     Findings: No bruising, erythema, lesion or rash.  Neurological:     General: No focal deficit present.     Mental Status: He is alert and oriented to person, place, and time.     Cranial Nerves: No cranial nerve deficit.     Sensory: No sensory deficit.     Motor: No weakness.  Coordination: Coordination normal.     Gait: Gait normal.     Deep Tendon Reflexes: Reflexes normal.  Psychiatric:        Mood and Affect: Mood normal.        Behavior: Behavior normal.        Thought Content: Thought content normal.        Judgment: Judgment normal.      Assessment & Plan:  1. Encounter for general adult medical examination with abnormal findings - Needs moderate weight loss  - Follow up in one year or sooner if needed - CBC with Differential/Platelet; Future - Comprehensive metabolic panel; Future - Hemoglobin A1c; Future - Lipid panel; Future - TSH; Future - TSH - Lipid panel - Hemoglobin A1c - Comprehensive metabolic panel - CBC with Differential/Platelet  2. Anxiety - Well controlled with Zoloft  - CBC with Differential/Platelet; Future - Comprehensive metabolic panel; Future - Hemoglobin A1c; Future - Lipid panel; Future - TSH; Future - TSH - Lipid panel - Hemoglobin A1c - Comprehensive metabolic panel - CBC with Differential/Platelet  3. Migraine with aura and with status migrainosus, not intractable - Controlled. No change of medications  - CBC with Differential/Platelet; Future - Comprehensive metabolic panel; Future - Hemoglobin A1c; Future - Lipid panel; Future - TSH; Future - TSH - Lipid panel - Hemoglobin A1c - Comprehensive metabolic panel - CBC with Differential/Platelet  4. Overweight - Encouraged weight loss through diet and exercise - CBC with Differential/Platelet; Future - Comprehensive metabolic panel; Future - Hemoglobin A1c; Future - Lipid panel; Future - TSH; Future - TSH - Lipid panel - Hemoglobin  A1c - Comprehensive metabolic panel - CBC with Differential/Platelet  5. Need for hepatitis C screening test  - Hep C Antibody  Shirline Frees, NP

## 2021-05-29 NOTE — Patient Instructions (Addendum)
It was great seeing you today   Best of luck on your first semester doctoral program   If you need anything let me know

## 2021-05-30 LAB — HEPATITIS C ANTIBODY
Hepatitis C Ab: NONREACTIVE
SIGNAL TO CUT-OFF: 0.01 (ref <1.00)

## 2021-08-03 DIAGNOSIS — F411 Generalized anxiety disorder: Secondary | ICD-10-CM | POA: Diagnosis not present

## 2021-08-05 ENCOUNTER — Other Ambulatory Visit: Payer: Self-pay | Admitting: Adult Health

## 2021-08-05 DIAGNOSIS — G43101 Migraine with aura, not intractable, with status migrainosus: Secondary | ICD-10-CM

## 2021-08-05 DIAGNOSIS — F419 Anxiety disorder, unspecified: Secondary | ICD-10-CM

## 2021-11-20 ENCOUNTER — Encounter: Payer: Self-pay | Admitting: Adult Health

## 2021-11-20 DIAGNOSIS — G43101 Migraine with aura, not intractable, with status migrainosus: Secondary | ICD-10-CM

## 2021-11-20 DIAGNOSIS — F419 Anxiety disorder, unspecified: Secondary | ICD-10-CM

## 2021-11-21 MED ORDER — ONDANSETRON 4 MG PO TBDP: 4.0000 mg | ORAL_TABLET | Freq: Three times a day (TID) | ORAL | 1 refills | Status: DC | PRN

## 2022-03-05 ENCOUNTER — Encounter: Payer: Self-pay | Admitting: Adult Health

## 2022-03-05 DIAGNOSIS — F419 Anxiety disorder, unspecified: Secondary | ICD-10-CM

## 2022-03-05 DIAGNOSIS — G43101 Migraine with aura, not intractable, with status migrainosus: Secondary | ICD-10-CM

## 2022-03-05 MED ORDER — ONDANSETRON 4 MG PO TBDP: 4.0000 mg | ORAL_TABLET | Freq: Three times a day (TID) | ORAL | 1 refills | Status: DC | PRN

## 2022-05-18 ENCOUNTER — Other Ambulatory Visit: Payer: Self-pay | Admitting: Adult Health

## 2022-05-18 DIAGNOSIS — G43101 Migraine with aura, not intractable, with status migrainosus: Secondary | ICD-10-CM

## 2022-05-18 DIAGNOSIS — F419 Anxiety disorder, unspecified: Secondary | ICD-10-CM

## 2022-05-22 NOTE — Telephone Encounter (Signed)
Patient need to schedule an ov for more refills. 

## 2022-07-07 DIAGNOSIS — Z20822 Contact with and (suspected) exposure to covid-19: Secondary | ICD-10-CM | POA: Diagnosis not present

## 2022-07-07 DIAGNOSIS — R232 Flushing: Secondary | ICD-10-CM | POA: Diagnosis not present

## 2022-07-07 DIAGNOSIS — J029 Acute pharyngitis, unspecified: Secondary | ICD-10-CM | POA: Diagnosis not present

## 2022-08-13 ENCOUNTER — Encounter: Payer: Self-pay | Admitting: Adult Health

## 2022-09-20 ENCOUNTER — Encounter: Payer: Self-pay | Admitting: Adult Health

## 2022-09-20 ENCOUNTER — Ambulatory Visit (INDEPENDENT_AMBULATORY_CARE_PROVIDER_SITE_OTHER): Payer: BC Managed Care – PPO | Admitting: Adult Health

## 2022-09-20 VITALS — BP 110/90 | HR 95 | Temp 98.1°F | Ht 73.0 in | Wt 293.0 lb

## 2022-09-20 DIAGNOSIS — E669 Obesity, unspecified: Secondary | ICD-10-CM

## 2022-09-20 DIAGNOSIS — G43101 Migraine with aura, not intractable, with status migrainosus: Secondary | ICD-10-CM | POA: Diagnosis not present

## 2022-09-20 DIAGNOSIS — F419 Anxiety disorder, unspecified: Secondary | ICD-10-CM | POA: Diagnosis not present

## 2022-09-20 DIAGNOSIS — Z23 Encounter for immunization: Secondary | ICD-10-CM

## 2022-09-20 DIAGNOSIS — Z0001 Encounter for general adult medical examination with abnormal findings: Secondary | ICD-10-CM | POA: Diagnosis not present

## 2022-09-20 DIAGNOSIS — E782 Mixed hyperlipidemia: Secondary | ICD-10-CM

## 2022-09-20 DIAGNOSIS — R7303 Prediabetes: Secondary | ICD-10-CM | POA: Diagnosis not present

## 2022-09-20 LAB — TSH: TSH: 2.12 u[IU]/mL (ref 0.35–5.50)

## 2022-09-20 LAB — CBC WITH DIFFERENTIAL/PLATELET
Basophils Absolute: 0.1 10*3/uL (ref 0.0–0.1)
Basophils Relative: 0.6 % (ref 0.0–3.0)
Eosinophils Absolute: 0.2 10*3/uL (ref 0.0–0.7)
Eosinophils Relative: 2.5 % (ref 0.0–5.0)
HCT: 47.5 % (ref 39.0–52.0)
Hemoglobin: 15.7 g/dL (ref 13.0–17.0)
Lymphocytes Relative: 19.6 % (ref 12.0–46.0)
Lymphs Abs: 1.8 10*3/uL (ref 0.7–4.0)
MCHC: 33 g/dL (ref 30.0–36.0)
MCV: 88.5 fl (ref 78.0–100.0)
Monocytes Absolute: 0.5 10*3/uL (ref 0.1–1.0)
Monocytes Relative: 5.9 % (ref 3.0–12.0)
Neutro Abs: 6.6 10*3/uL (ref 1.4–7.7)
Neutrophils Relative %: 71.4 % (ref 43.0–77.0)
Platelets: 358 10*3/uL (ref 150.0–400.0)
RBC: 5.37 Mil/uL (ref 4.22–5.81)
RDW: 13.8 % (ref 11.5–15.5)
WBC: 9.3 10*3/uL (ref 4.0–10.5)

## 2022-09-20 LAB — COMPREHENSIVE METABOLIC PANEL
ALT: 58 U/L — ABNORMAL HIGH (ref 0–53)
AST: 32 U/L (ref 0–37)
Albumin: 4.6 g/dL (ref 3.5–5.2)
Alkaline Phosphatase: 72 U/L (ref 39–117)
BUN: 11 mg/dL (ref 6–23)
CO2: 27 mEq/L (ref 19–32)
Calcium: 9.9 mg/dL (ref 8.4–10.5)
Chloride: 100 mEq/L (ref 96–112)
Creatinine, Ser: 0.88 mg/dL (ref 0.40–1.50)
GFR: 115.19 mL/min (ref >60.00)
Glucose, Bld: 93 mg/dL (ref 70–99)
Potassium: 3.8 mEq/L (ref 3.5–5.1)
Sodium: 137 mEq/L (ref 135–145)
Total Bilirubin: 0.7 mg/dL (ref 0.2–1.2)
Total Protein: 8 g/dL (ref 6.0–8.3)

## 2022-09-20 LAB — LIPID PANEL
Cholesterol: 216 mg/dL — ABNORMAL HIGH (ref 0–200)
HDL: 43.9 mg/dL (ref >39.00)
LDL Cholesterol: 142 mg/dL — ABNORMAL HIGH (ref 0–99)
NonHDL: 172.41
Total CHOL/HDL Ratio: 5
Triglycerides: 152 mg/dL — ABNORMAL HIGH (ref 0.0–149.0)
VLDL: 30.4 mg/dL (ref 0.0–40.0)

## 2022-09-20 LAB — HEMOGLOBIN A1C: Hgb A1c MFr Bld: 6 % (ref 4.6–6.5)

## 2022-09-20 MED ORDER — SERTRALINE HCL 50 MG PO TABS: ORAL_TABLET | ORAL | 1 refills | Status: DC

## 2022-09-20 MED ORDER — ONDANSETRON 4 MG PO TBDP: 4.0000 mg | ORAL_TABLET | Freq: Three times a day (TID) | ORAL | 6 refills | Status: DC | PRN

## 2022-09-20 NOTE — Progress Notes (Signed)
Subjective:    Patient ID: Riley Davis, male    DOB: Jun 23, 1991, 31 y.o.   MRN: [id number]  HPI Patient presents for yearly preventative medicine examination. He is a pleasant 31 year old male who  has a past medical history of Chicken pox, Frequent headaches, GERD (gastroesophageal reflux disease), and Migraines.  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   Anxiety - well controlled with Zoloft 50 mg daily. He is out of the medication currently.   Pre diabetic - not currently on medication  Lab Results  Component Value Date   HGBA1C 5.7 05/29/2021   Hyperlipidemia - not currently on medication therapy  Lab Results  Component Value Date   CHOL 239 (H) 05/29/2021   HDL 41.00 05/29/2021   LDLCALC 165 (H) 05/29/2021   TRIG 168.0 (H) 05/29/2021   CHOLHDL 6 05/29/2021   Migraine headaches - happening infrequently - related to stress and anxiety.     All immunizations and health maintenance protocols were reviewed with the patient and needed orders were placed. Due for flu shot   Appropriate screening laboratory values were ordered for the patient including screening of hyperlipidemia, renal function and hepatic function.  Medication reconciliation,  past medical history, social history, problem list and allergies were reviewed in detail with the patient  Goals were established with regard to weight loss, exercise, and  diet in compliance with medications. He has not been exercising nor eating healthy as he feels as though he does not have a lot of time during his doctoral program  Wt Readings from Last 3 Encounters:  09/20/22 293 lb (132.9 kg)  05/29/21 278 lb 9.6 oz (126.4 kg)  02/08/21 279 lb 12.8 oz (126.9 kg)   No acute complaints   Review of Systems  Constitutional: Negative.   HENT: Negative.    Eyes: Negative.   Respiratory: Negative.    Cardiovascular: Negative.   Gastrointestinal: Negative.   Endocrine: Negative.   Genitourinary: Negative.    Musculoskeletal: Negative.   Skin: Negative.   Allergic/Immunologic: Negative.   Neurological: Negative.   Hematological: Negative.   Psychiatric/Behavioral: Negative.    All other systems reviewed and are negative.  Past Medical History:  Diagnosis Date   Chicken pox    Frequent headaches    GERD (gastroesophageal reflux disease)    Migraines     Social History   Socioeconomic History   Marital status: Single    Spouse name: Not on file   Number of children: Not on file   Years of education: Not on file   Highest education level: Not on file  Occupational History   Not on file  Tobacco Use   Smoking status: Never   Smokeless tobacco: Never  Substance and Sexual Activity   Alcohol use: Yes    Alcohol/week: 0.0 standard drinks of alcohol    Comment: socailly    Drug use: No   Sexual activity: Not on file  Other Topics Concern   Not on file  Social History Narrative   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    Not married   Social Determinants of Corporate investment banker Strain: Not on file  Food Insecurity: Not on file  Transportation Needs: Not on file  Physical Activity: Not on file  Stress: Not on file  Social Connections: Not on file  Intimate Partner Violence: Not on file    History reviewed. No pertinent surgical history.  Family History  Problem Relation Age of Onset   Cancer Father        lung cancer    No Known Allergies  Current Outpatient Medications on File Prior to Visit  Medication Sig Dispense Refill   sertraline (ZOLOFT) 50 MG tablet TAKE 1 TABLET BY MOUTH EVERY DAY 90 tablet 1   aspirin-acetaminophen-caffeine (EXCEDRIN MIGRAINE) 250-250-65 MG tablet Take 1 tablet by mouth every 6 (six) hours as needed for headache or migraine. (Patient not taking: Reported on 09/20/2022)     ondansetron (ZOFRAN ODT) 4 MG disintegrating tablet Take 1 tablet (4 mg total) by mouth every 8 (eight) hours as needed for nausea or vomiting. (Patient not taking:  Reported on 09/20/2022) 20 tablet 1   No current facility-administered medications on file prior to visit.    BP (!) 110/90   Pulse 95   Temp 98.1 F (36.7 C) (Oral)   Ht 6\' 1"  (1.854 m)   Wt 293 lb (132.9 kg)   SpO2 98%   BMI 38.66 kg/m       Objective:   Physical Exam Vitals and nursing note reviewed.  Constitutional:      General: He is not in acute distress.    Appearance: Normal appearance. He is well-developed. He is obese.  HENT:     Head: Normocephalic and atraumatic.     Right Ear: Tympanic membrane, ear canal and external ear normal. There is no impacted cerumen.     Left Ear: Tympanic membrane, ear canal and external ear normal. There is no impacted cerumen.     Nose: Nose normal. No congestion or rhinorrhea.     Mouth/Throat:     Mouth: Mucous membranes are moist.     Pharynx: Oropharynx is clear. No oropharyngeal exudate or posterior oropharyngeal erythema.  Eyes:     General:        Right eye: No discharge.        Left eye: No discharge.     Extraocular Movements: Extraocular movements intact.     Conjunctiva/sclera: Conjunctivae normal.     Pupils: Pupils are equal, round, and reactive to light.  Neck:     Vascular: No carotid bruit.     Trachea: No tracheal deviation.  Cardiovascular:     Rate and Rhythm: Normal rate and regular rhythm.     Pulses: Normal pulses.     Heart sounds: Normal heart sounds. No murmur heard.    No friction rub. No gallop.  Pulmonary:     Effort: Pulmonary effort is normal. No respiratory distress.     Breath sounds: Normal breath sounds. No stridor. No wheezing, rhonchi or rales.  Chest:     Chest wall: No tenderness.  Abdominal:     General: Bowel sounds are normal. There is no distension.     Palpations: Abdomen is soft. There is no mass.     Tenderness: There is no abdominal tenderness. There is no right CVA tenderness, left CVA tenderness, guarding or rebound.     Hernia: No hernia is present.  Musculoskeletal:         General: No swelling, tenderness, deformity or signs of injury. Normal range of motion.     Right lower leg: No edema.     Left lower leg: No edema.  Lymphadenopathy:     Cervical: No cervical adenopathy.  Skin:    General: Skin is warm and dry.     Capillary Refill: Capillary refill takes less than 2 seconds.     Coloration:  Skin is not jaundiced or pale.     Findings: No bruising, erythema, lesion or rash.  Neurological:     General: No focal deficit present.     Mental Status: He is alert and oriented to person, place, and time.     Cranial Nerves: No cranial nerve deficit.     Sensory: No sensory deficit.     Motor: No weakness.     Coordination: Coordination normal.     Gait: Gait normal.     Deep Tendon Reflexes: Reflexes normal.  Psychiatric:        Mood and Affect: Mood normal.        Behavior: Behavior normal.        Thought Content: Thought content normal.        Judgment: Judgment normal.       Assessment & Plan:  1. Encounter for general adult medical examination with abnormal findings - Follow up in one year or sooner if needed - Weight loss through lifestyle modifications  - CBC with Differential/Platelet; Future - Comprehensive metabolic panel; Future - Hemoglobin A1c; Future - Lipid panel; Future - TSH; Future  2. Anxiety  - sertraline (ZOLOFT) 50 MG tablet; TAKE 1 TABLET BY MOUTH EVERY DAY  Dispense: 90 tablet; Refill: 1 - ondansetron (ZOFRAN ODT) 4 MG disintegrating tablet; Take 1 tablet (4 mg total) by mouth every 8 (eight) hours as needed for nausea or vomiting.  Dispense: 20 tablet; Refill: 6  3. Migraine with aura and with status migrainosus, not intractable  - CBC with Differential/Platelet; Future - Comprehensive metabolic panel; Future - Hemoglobin A1c; Future - Lipid panel; Future - TSH; Future - sertraline (ZOLOFT) 50 MG tablet; TAKE 1 TABLET BY MOUTH EVERY DAY  Dispense: 90 tablet; Refill: 1 - ondansetron (ZOFRAN ODT) 4 MG  disintegrating tablet; Take 1 tablet (4 mg total) by mouth every 8 (eight) hours as needed for nausea or vomiting.  Dispense: 20 tablet; Refill: 6  4. Class 2 obesity - Needs significant weight loss through diet and exercise  - CBC with Differential/Platelet; Future - Comprehensive metabolic panel; Future - Hemoglobin A1c; Future - Lipid panel; Future - TSH; Future  5. Pre-diabetes - Consider starting on agent  - Stressed the importance of diet and exercise  - CBC with Differential/Platelet; Future - Comprehensive metabolic panel; Future - Hemoglobin A1c; Future - Lipid panel; Future - TSH; Future  6. Mixed hyperlipidemia - Consider statin  - CBC with Differential/Platelet; Future - Comprehensive metabolic panel; Future - Hemoglobin A1c; Future - Lipid panel; Future - TSH; Future  7. Need for immunization against influenza  - Flu Vaccine QUAD 13mo+IM (Fluarix, Fluzone & Alfiuria Quad PF)  Shirline Frees, NP

## 2022-09-20 NOTE — Patient Instructions (Signed)
It was great seeing you today   We will follow up with you regarding your lab work   Please let me know if you need anything   

## 2022-12-13 ENCOUNTER — Encounter: Payer: Self-pay | Admitting: Adult Health

## 2023-03-15 ENCOUNTER — Other Ambulatory Visit: Payer: Self-pay | Admitting: Adult Health

## 2023-03-15 DIAGNOSIS — G43101 Migraine with aura, not intractable, with status migrainosus: Secondary | ICD-10-CM

## 2023-03-15 DIAGNOSIS — F419 Anxiety disorder, unspecified: Secondary | ICD-10-CM

## 2023-09-23 ENCOUNTER — Encounter: Payer: Self-pay | Admitting: Adult Health

## 2023-09-23 DIAGNOSIS — G43101 Migraine with aura, not intractable, with status migrainosus: Secondary | ICD-10-CM

## 2023-09-23 DIAGNOSIS — F419 Anxiety disorder, unspecified: Secondary | ICD-10-CM

## 2023-09-24 ENCOUNTER — Ambulatory Visit (INDEPENDENT_AMBULATORY_CARE_PROVIDER_SITE_OTHER): Payer: BC Managed Care – PPO | Admitting: Adult Health

## 2023-09-24 ENCOUNTER — Encounter: Payer: Self-pay | Admitting: Adult Health

## 2023-09-24 VITALS — BP 100/70 | HR 96 | Temp 98.7°F | Ht 73.0 in | Wt 293.0 lb

## 2023-09-24 DIAGNOSIS — G43101 Migraine with aura, not intractable, with status migrainosus: Secondary | ICD-10-CM

## 2023-09-24 DIAGNOSIS — E782 Mixed hyperlipidemia: Secondary | ICD-10-CM | POA: Diagnosis not present

## 2023-09-24 DIAGNOSIS — R7303 Prediabetes: Secondary | ICD-10-CM

## 2023-09-24 DIAGNOSIS — F419 Anxiety disorder, unspecified: Secondary | ICD-10-CM | POA: Diagnosis not present

## 2023-09-24 DIAGNOSIS — Z0000 Encounter for general adult medical examination without abnormal findings: Secondary | ICD-10-CM | POA: Diagnosis not present

## 2023-09-24 DIAGNOSIS — E66812 Obesity, class 2: Secondary | ICD-10-CM | POA: Diagnosis not present

## 2023-09-24 LAB — CBC
HCT: 49.6 % (ref 39.0–52.0)
Hemoglobin: 16.1 g/dL (ref 13.0–17.0)
MCHC: 32.4 g/dL (ref 30.0–36.0)
MCV: 88.8 fL (ref 78.0–100.0)
Platelets: 402 10*3/uL — ABNORMAL HIGH (ref 150.0–400.0)
RBC: 5.59 Mil/uL (ref 4.22–5.81)
RDW: 13.8 % (ref 11.5–15.5)
WBC: 11.9 10*3/uL — ABNORMAL HIGH (ref 4.0–10.5)

## 2023-09-24 LAB — TSH: TSH: 3.19 u[IU]/mL (ref 0.35–5.50)

## 2023-09-24 LAB — COMPREHENSIVE METABOLIC PANEL
ALT: 29 U/L (ref 0–53)
AST: 17 U/L (ref 0–37)
Albumin: 4.6 g/dL (ref 3.5–5.2)
Alkaline Phosphatase: 84 U/L (ref 39–117)
BUN: 12 mg/dL (ref 6–23)
CO2: 28 meq/L (ref 19–32)
Calcium: 10 mg/dL (ref 8.4–10.5)
Chloride: 100 meq/L (ref 96–112)
Creatinine, Ser: 0.98 mg/dL (ref 0.40–1.50)
GFR: 102.57 mL/min (ref >60.00)
Glucose, Bld: 94 mg/dL (ref 70–99)
Potassium: 3.9 meq/L (ref 3.5–5.1)
Sodium: 138 meq/L (ref 135–145)
Total Bilirubin: 0.7 mg/dL (ref 0.2–1.2)
Total Protein: 8.1 g/dL (ref 6.0–8.3)

## 2023-09-24 LAB — LIPID PANEL
Cholesterol: 228 mg/dL — ABNORMAL HIGH (ref 0–200)
HDL: 43.8 mg/dL (ref >39.00)
LDL Cholesterol: 150 mg/dL — ABNORMAL HIGH (ref 0–99)
NonHDL: 184.21
Total CHOL/HDL Ratio: 5
Triglycerides: 171 mg/dL — ABNORMAL HIGH (ref 0.0–149.0)
VLDL: 34.2 mg/dL (ref 0.0–40.0)

## 2023-09-24 LAB — HEMOGLOBIN A1C: Hgb A1c MFr Bld: 5.9 % (ref 4.6–6.5)

## 2023-09-24 MED ORDER — ONDANSETRON 4 MG PO TBDP: 4.0000 mg | ORAL_TABLET | Freq: Three times a day (TID) | ORAL | 6 refills | Status: DC | PRN

## 2023-09-24 NOTE — Progress Notes (Signed)
Subjective:    Patient ID: Riley Davis, male    DOB: Jul 22, 1991, 32 y.o.   MRN: [id number]  HPI  Patient presents for yearly preventative medicine examination. He is a pleasant 32 year old male who  has a past medical history of Chicken pox, Frequent headaches, GERD (gastroesophageal reflux disease), and Migraines.  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 He is out of the medication currently.   Pre diabetic - not currently on medication  Lab Results  Component Value Date   HGBA1C 6.0 09/20/2022   Hyperlipidemia - not currently on medication therapy  Lab Results  Component Value Date   CHOL 216 (H) 09/20/2022   HDL 43.90 09/20/2022   LDLCALC 142 (H) 09/20/2022   TRIG 152.0 (H) 09/20/2022   CHOLHDL 5 09/20/2022    Migraine headaches - happening infrequently - related to stress and anxiety.     All immunizations and health maintenance protocols were reviewed with the patient and needed orders were placed.  Appropriate screening laboratory values were ordered for the patient including screening of hyperlipidemia, renal function and hepatic function.  Medication reconciliation,  past medical history, social history, problem list and allergies were reviewed in detail with the patient  Goals were established with regard to weight loss, exercise, and  diet in compliance with medications. He has not been working out nor has been following a specific diet.   Wt Readings from Last 3 Encounters:  09/24/23 293 lb (132.9 kg)  09/20/22 293 lb (132.9 kg)  05/29/21 278 lb 9.6 oz (126.4 kg)   Review of Systems  Constitutional: Negative.   HENT: Negative.    Eyes: Negative.   Respiratory: Negative.    Cardiovascular: Negative.   Gastrointestinal: Negative.   Endocrine: Negative.   Genitourinary: Negative.   Musculoskeletal: Negative.   Skin: Negative.   Allergic/Immunologic: Negative.   Neurological:  Negative.   Hematological: Negative.   Psychiatric/Behavioral: Negative.    All other systems reviewed and are negative.  Past Medical History:  Diagnosis Date   Chicken pox    Frequent headaches    GERD (gastroesophageal reflux disease)    Migraines     Social History   Socioeconomic History   Marital status: Single    Spouse name: Not on file   Number of children: Not on file   Years of education: Not on file   Highest education level: Master's degree (e.g., MA, MS, MEng, MEd, MSW, MBA)  Occupational History   Not on file  Tobacco Use   Smoking status: Never   Smokeless tobacco: Never  Substance and Sexual Activity   Alcohol use: Yes    Alcohol/week: 0.0 standard drinks of alcohol    Comment: socailly    Drug use: No   Sexual activity: Not on file  Other Topics Concern   Not on file  Social History Narrative   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    Not married   Social Determinants of Health   Financial Resource Strain: Low Risk  (09/23/2023)   Overall Financial Resource Strain (CARDIA)    Difficulty of Paying Living Expenses: Not very hard  Food Insecurity: No Food Insecurity (09/23/2023)   Hunger Vital Sign    Worried About Running Out of Food in the Last Year: Never true    Ran Out of Food in the Last Year: Never true  Transportation Needs: No Transportation Needs (  09/23/2023)   PRAPARE - Administrator, Civil Service (Medical): No    Lack of Transportation (Non-Medical): No  Physical Activity: Unknown (09/23/2023)   Exercise Vital Sign    Days of Exercise per Week: 0 days    Minutes of Exercise per Session: Not on file  Stress: Stress Concern Present (09/23/2023)   [employer] of Occupational Health - Occupational Stress Questionnaire    Feeling of Stress : To some extent  Social Connections: Socially Isolated (09/23/2023)   Social Connection and Isolation Panel [NHANES]    Frequency of Communication with Friends and Family: Once a week     Frequency of Social Gatherings with Friends and Family: Once a week    Attends Religious Services: 1 to 4 times per year    Active Member of [employer] or Organizations: No    Attends Engineer, structural: Not on file    Marital Status: Never married  Intimate Partner Violence: Not on file    History reviewed. No pertinent surgical history.  Family History  Problem Relation Age of Onset   Cancer Father        lung cancer    No Known Allergies  Current Outpatient Medications on File Prior to Visit  Medication Sig Dispense Refill   aspirin-acetaminophen-caffeine (EXCEDRIN MIGRAINE) 250-250-65 MG tablet Take 1 tablet by mouth every 6 (six) hours as needed for headache or migraine.     ondansetron (ZOFRAN ODT) 4 MG disintegrating tablet Take 1 tablet (4 mg total) by mouth every 8 (eight) hours as needed for nausea or vomiting. 20 tablet 6   sertraline (ZOLOFT) 50 MG tablet TAKE 1 TABLET BY MOUTH EVERY DAY 90 tablet 1   No current facility-administered medications on file prior to visit.    BP 100/70   Pulse 96   Temp 98.7 F (37.1 C) (Oral)   Ht 6\' 1"  (1.854 m)   Wt 293 lb (132.9 kg)   SpO2 97%   BMI 38.66 kg/m       Objective:   Physical Exam Vitals and nursing note reviewed.  Constitutional:      General: He is not in acute distress.    Appearance: Normal appearance. He is not ill-appearing.  HENT:     Head: Normocephalic and atraumatic.     Right Ear: Tympanic membrane, ear canal and external ear normal. There is no impacted cerumen.     Left Ear: Tympanic membrane, ear canal and external ear normal. There is no impacted cerumen.     Nose: Nose normal. No congestion or rhinorrhea.     Mouth/Throat:     Mouth: Mucous membranes are moist.     Pharynx: Oropharynx is clear.  Eyes:     Extraocular Movements: Extraocular movements intact.     Conjunctiva/sclera: Conjunctivae normal.     Pupils: Pupils are equal, round, and reactive to light.  Neck:     Vascular:  No carotid bruit.  Cardiovascular:     Rate and Rhythm: Normal rate and regular rhythm.     Pulses: Normal pulses.     Heart sounds: No murmur heard.    No friction rub. No gallop.  Pulmonary:     Effort: Pulmonary effort is normal.     Breath sounds: Normal breath sounds.  Abdominal:     General: Abdomen is flat. Bowel sounds are normal. There is no distension.     Palpations: Abdomen is soft. There is no mass.     Tenderness: There  is no abdominal tenderness. There is no guarding or rebound.     Hernia: No hernia is present.  Musculoskeletal:        General: Normal range of motion.     Cervical back: Normal range of motion and neck supple.  Lymphadenopathy:     Cervical: No cervical adenopathy.  Skin:    General: Skin is warm and dry.     Capillary Refill: Capillary refill takes less than 2 seconds.  Neurological:     General: No focal deficit present.     Mental Status: He is alert and oriented to person, place, and time.  Psychiatric:        Mood and Affect: Mood normal.        Behavior: Behavior normal.        Thought Content: Thought content normal.        Judgment: Judgment normal.       Assessment & Plan:  1. Routine general medical examination at a health care facility Today patient counseled on age appropriate routine health concerns for screening and prevention, each reviewed and up to date or declined. Immunizations reviewed and up to date or declined. Labs ordered and reviewed. Risk factors for depression reviewed and negative. Hearing function and visual acuity are intact. ADLs screened and addressed as needed. Functional ability and level of safety reviewed and appropriate. Education, counseling and referrals performed based on assessed risks today. Patient provided with a copy of personalized plan for preventive services. - Work on exercise and eat healthy  - Follow up in one year or sooner if needed   2. Anxiety - Doing well with Zoloft 50 mg  - Lipid panel;  Future - TSH; Future - CBC; Future - Comprehensive metabolic panel; Future  3. Class 2 obesity - Needs to start exercising and eating healthy - I would like a 20 pound weight loss over the next year  - Lipid panel; Future - TSH; Future - CBC; Future - Comprehensive metabolic panel; Future - Hemoglobin A1c; Future  4. Pre-diabetes - Consider metformin  - Lipid panel; Future - TSH; Future - CBC; Future - Comprehensive metabolic panel; Future - Hemoglobin A1c; Future  5. Mixed hyperlipidemia - Consider statin  - Lipid panel; Future - TSH; Future - CBC; Future - Comprehensive metabolic panel; Future  6. Migraine with aura and with status migrainosus, not intractable - Continue with Excedrin migraine - Lipid panel; Future - TSH; Future - CBC; Future - Comprehensive metabolic panel; Future  Shirline Frees, NP

## 2023-09-24 NOTE — Patient Instructions (Signed)
It was great seeing you today   We will follow up with you regarding your lab work   Please let me know if you need anything   

## 2023-09-25 ENCOUNTER — Other Ambulatory Visit: Payer: Self-pay | Admitting: Adult Health

## 2023-09-25 DIAGNOSIS — D72829 Elevated white blood cell count, unspecified: Secondary | ICD-10-CM

## 2023-09-26 MED ORDER — ATORVASTATIN CALCIUM 10 MG PO TABS: 10.0000 mg | ORAL_TABLET | Freq: Every day | ORAL | 2 refills | Status: DC

## 2023-09-26 NOTE — Addendum Note (Signed)
Addended by: Waymon Amato R on: 09/26/2023 04:52 PM   Modules accepted: Orders

## 2023-11-22 ENCOUNTER — Other Ambulatory Visit: Payer: Self-pay | Admitting: Adult Health

## 2023-11-22 DIAGNOSIS — F419 Anxiety disorder, unspecified: Secondary | ICD-10-CM

## 2023-11-22 DIAGNOSIS — G43101 Migraine with aura, not intractable, with status migrainosus: Secondary | ICD-10-CM

## 2024-05-31 ENCOUNTER — Encounter: Payer: Self-pay | Admitting: Adult Health

## 2024-06-01 ENCOUNTER — Other Ambulatory Visit: Payer: Self-pay | Admitting: Adult Health

## 2024-06-01 DIAGNOSIS — F419 Anxiety disorder, unspecified: Secondary | ICD-10-CM

## 2024-06-01 DIAGNOSIS — G43101 Migraine with aura, not intractable, with status migrainosus: Secondary | ICD-10-CM

## 2024-06-01 MED ORDER — ONDANSETRON 4 MG PO TBDP: 4.0000 mg | ORAL_TABLET | Freq: Three times a day (TID) | ORAL | 0 refills | Status: DC | PRN

## 2024-06-01 NOTE — Telephone Encounter (Signed)
**Note De-identified  [place] Obfuscation** Please advise 

## 2024-06-23 ENCOUNTER — Other Ambulatory Visit: Payer: Self-pay | Admitting: Adult Health

## 2024-06-23 DIAGNOSIS — F419 Anxiety disorder, unspecified: Secondary | ICD-10-CM

## 2024-06-23 DIAGNOSIS — G43101 Migraine with aura, not intractable, with status migrainosus: Secondary | ICD-10-CM

## 2024-06-30 ENCOUNTER — Encounter: Payer: Self-pay | Admitting: Adult Health

## 2024-06-30 NOTE — Telephone Encounter (Signed)
 Form filled out. Pt notified of update and will pick up form. Form placed in front office file cabinet.

## 2024-09-22 ENCOUNTER — Encounter: Payer: Self-pay | Admitting: Adult Health

## 2024-09-24 ENCOUNTER — Encounter: Payer: BC Managed Care – PPO | Admitting: Adult Health

## 2024-10-29 ENCOUNTER — Ambulatory Visit: Admitting: Adult Health

## 2024-10-29 ENCOUNTER — Encounter: Payer: Self-pay | Admitting: Adult Health

## 2024-10-29 ENCOUNTER — Encounter (INDEPENDENT_AMBULATORY_CARE_PROVIDER_SITE_OTHER): Payer: Self-pay

## 2024-10-29 ENCOUNTER — Ambulatory Visit: Payer: Self-pay | Admitting: Adult Health

## 2024-10-29 VITALS — BP 120/88 | HR 81 | Temp 98.7°F | Ht 72.0 in | Wt 287.0 lb

## 2024-10-29 DIAGNOSIS — E782 Mixed hyperlipidemia: Secondary | ICD-10-CM

## 2024-10-29 DIAGNOSIS — G43101 Migraine with aura, not intractable, with status migrainosus: Secondary | ICD-10-CM

## 2024-10-29 DIAGNOSIS — Z23 Encounter for immunization: Secondary | ICD-10-CM

## 2024-10-29 DIAGNOSIS — F419 Anxiety disorder, unspecified: Secondary | ICD-10-CM

## 2024-10-29 DIAGNOSIS — E66812 Obesity, class 2: Secondary | ICD-10-CM

## 2024-10-29 DIAGNOSIS — Z0000 Encounter for general adult medical examination without abnormal findings: Secondary | ICD-10-CM

## 2024-10-29 DIAGNOSIS — S0300XD Dislocation of jaw, unspecified side, subsequent encounter: Secondary | ICD-10-CM

## 2024-10-29 DIAGNOSIS — R7303 Prediabetes: Secondary | ICD-10-CM

## 2024-10-29 LAB — COMPREHENSIVE METABOLIC PANEL WITH GFR
ALT: 18 U/L (ref 0–53)
AST: 14 U/L (ref 0–37)
Albumin: 4.5 g/dL (ref 3.5–5.2)
Alkaline Phosphatase: 90 U/L (ref 39–117)
BUN: 10 mg/dL (ref 6–23)
CO2: 25 meq/L (ref 19–32)
Calcium: 9.6 mg/dL (ref 8.4–10.5)
Chloride: 102 meq/L (ref 96–112)
Creatinine, Ser: 0.84 mg/dL (ref 0.40–1.50)
GFR: 115.11 mL/min (ref >60.00)
Glucose, Bld: 90 mg/dL (ref 70–99)
Potassium: 4.2 meq/L (ref 3.5–5.1)
Sodium: 139 meq/L (ref 135–145)
Total Bilirubin: 0.8 mg/dL (ref 0.2–1.2)
Total Protein: 7.3 g/dL (ref 6.0–8.3)

## 2024-10-29 LAB — LIPID PANEL
Cholesterol: 157 mg/dL (ref 0–200)
HDL: 38.4 mg/dL — ABNORMAL LOW (ref >39.00)
LDL Cholesterol: 97 mg/dL (ref 0–99)
NonHDL: 118.65
Total CHOL/HDL Ratio: 4
Triglycerides: 107 mg/dL (ref 0.0–149.0)
VLDL: 21.4 mg/dL (ref 0.0–40.0)

## 2024-10-29 LAB — HEMOGLOBIN A1C: Hgb A1c MFr Bld: 5.6 % (ref 4.6–6.5)

## 2024-10-29 LAB — CBC
HCT: 46.3 % (ref 39.0–52.0)
Hemoglobin: 15.5 g/dL (ref 13.0–17.0)
MCHC: 33.5 g/dL (ref 30.0–36.0)
MCV: 87.1 fl (ref 78.0–100.0)
Platelets: 335 K/uL (ref 150.0–400.0)
RBC: 5.32 Mil/uL (ref 4.22–5.81)
RDW: 13.6 % (ref 11.5–15.5)
WBC: 8.5 K/uL (ref 4.0–10.5)

## 2024-10-29 LAB — TSH: TSH: 2.42 u[IU]/mL (ref 0.35–5.50)

## 2024-10-29 MED ORDER — ONDANSETRON 4 MG PO TBDP: 4.0000 mg | ORAL_TABLET | Freq: Three times a day (TID) | ORAL | 3 refills | Status: AC | PRN

## 2024-10-29 NOTE — Progress Notes (Signed)
 Subjective:    Patient ID: Riley Davis, male    DOB: 1990-12-06, 33 y.o.   MRN: [id number]  HPI Patient presents for yearly preventative medicine examination. She is a pleasant 33 year old male who  has a past medical history of Chicken pox, Frequent headaches, GERD (gastroesophageal reflux disease), and Migraines.  Anxiety - well controlled with Zoloft  50 mg daily. He feels well controlled. Will take Zofran  for anxiety induced nausea occasionally.  Pre diabetic - not currently on medication  Lab Results  Component Value Date   HGBA1C 5.9 09/24/2023   HGBA1C 6.0 09/20/2022   HGBA1C 5.7 05/29/2021   Hyperlipidemia - managed with Lipitor 10 mg daily. He denies myalgia or fatigue  Lab Results  Component Value Date   CHOL 228 (H) 09/24/2023   HDL 43.80 09/24/2023   LDLCALC 150 (H) 09/24/2023   TRIG 171.0 (H) 09/24/2023   CHOLHDL 5 09/24/2023    Migraine headaches - happening infrequently - related to stress and anxiety.     TMJ - chronic issues Reports worsening pain and more frequent dislocations on the right side where he needs to physically push to get the dislocation back in the socket. He has pain when chewing and opening his mouth   All immunizations and health maintenance protocols were reviewed with the patient and needed orders were placed.  Appropriate screening laboratory values were ordered for the patient including screening of hyperlipidemia, renal function and hepatic function.   Medication reconciliation,  past medical history, social history, problem list and allergies were reviewed in detail with the patient  Goals were established with regard to weight loss, exercise, and  diet in compliance with medication. He is doing a lot of walking and is eating healthier.  Wt Readings from Last 3 Encounters:  10/29/24 287 lb (130.2 kg)  09/24/23 293 lb (132.9 kg)  09/20/22 293 lb (132.9 kg)   Review of Systems  Constitutional: Negative.   HENT: Negative.     Eyes: Negative.   Respiratory: Negative.    Cardiovascular: Negative.   Gastrointestinal: Negative.   Endocrine: Negative.   Genitourinary: Negative.   Musculoskeletal:  Positive for arthralgias.  Skin: Negative.   Allergic/Immunologic: Negative.   Neurological: Negative.   Hematological: Negative.   Psychiatric/Behavioral: Negative.    All other systems reviewed and are negative.  Past Medical History:  Diagnosis Date   Chicken pox    Frequent headaches    GERD (gastroesophageal reflux disease)    Migraines     Social History   Socioeconomic History   Marital status: Single    Spouse name: Not on file   Number of children: Not on file   Years of education: Not on file   Highest education level: Master's degree (e.g., MA, MS, MEng, MEd, MSW, MBA)  Occupational History   Not on file  Tobacco Use   Smoking status: Never   Smokeless tobacco: Never  Substance and Sexual Activity   Alcohol use: Yes    Alcohol/week: 0.0 standard drinks of alcohol    Comment: socailly    Drug use: No   Sexual activity: Not on file  Other Topics Concern   Not on file  Social History Narrative   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    Not married   Social Drivers of Health   Tobacco Use: Low Risk (10/29/2024)   Patient History    Smoking Tobacco Use: Never    Smokeless Tobacco Use: Never    Passive  Exposure: Not on file  Financial Resource Strain: Low Risk (09/23/2023)   Overall Financial Resource Strain (CARDIA)    Difficulty of Paying Living Expenses: Not very hard  Food Insecurity: No Food Insecurity (09/23/2023)   Hunger Vital Sign    Worried About Running Out of Food in the Last Year: Never true    Ran Out of Food in the Last Year: Never true  Transportation Needs: No Transportation Needs (09/23/2023)   PRAPARE - Administrator, Civil Service (Medical): No    Lack of Transportation (Non-Medical): No  Physical Activity: Unknown (09/23/2023)   Exercise Vital Sign    Days  of Exercise per Week: 0 days    Minutes of Exercise per Session: Not on file  Stress: Stress Concern Present (09/23/2023)   [employer] of Occupational Health - Occupational Stress Questionnaire    Feeling of Stress : To some extent  Social Connections: Socially Isolated (09/23/2023)   Social Connection and Isolation Panel    Frequency of Communication with Friends and Family: Once a week    Frequency of Social Gatherings with Friends and Family: Once a week    Attends Religious Services: 1 to 4 times per year    Active Member of [employer] or Organizations: No    Attends Engineer, Structural: Not on file    Marital Status: Never married  Intimate Partner Violence: Not on file  Depression (PHQ2-9): Low Risk (10/29/2024)   Depression (PHQ2-9)    PHQ-2 Score: 0  Alcohol Screen: Low Risk (09/23/2023)   Alcohol Screen    Last Alcohol Screening Score (AUDIT): 0  Housing: Low Risk (09/23/2023)   Housing    Last Housing Risk Score: 0  Utilities: Not on file  Health Literacy: Not on file    History reviewed. No pertinent surgical history.  Family History  Problem Relation Age of Onset   Cancer Father        lung cancer    Allergies[1]  Medications Ordered Prior to Encounter[2]  BP 120/88   Pulse 81   Temp 98.7 F (37.1 C) (Oral)   Ht 6' (1.829 m)   Wt 287 lb (130.2 kg)   SpO2 96%   BMI 38.92 kg/m       Objective:   Physical Exam Vitals and nursing note reviewed.  Constitutional:      General: He is not in acute distress.    Appearance: Normal appearance. He is obese. He is not ill-appearing.  HENT:     Head: Normocephalic and atraumatic.      Right Ear: Tympanic membrane, ear canal and external ear normal. There is no impacted cerumen.     Left Ear: Tympanic membrane, ear canal and external ear normal. There is no impacted cerumen.     Nose: Nose normal. No congestion or rhinorrhea.     Mouth/Throat:     Mouth: Mucous membranes are moist.     Pharynx:  Oropharynx is clear.     Comments: Popping  Eyes:     Extraocular Movements: Extraocular movements intact.     Conjunctiva/sclera: Conjunctivae normal.     Pupils: Pupils are equal, round, and reactive to light.  Neck:     Vascular: No carotid bruit.  Cardiovascular:     Rate and Rhythm: Normal rate and regular rhythm.     Pulses: Normal pulses.     Heart sounds: No murmur heard.    No friction rub. No gallop.  Pulmonary:  Effort: Pulmonary effort is normal.     Breath sounds: Normal breath sounds.  Abdominal:     General: Abdomen is flat. Bowel sounds are normal. There is no distension.     Palpations: Abdomen is soft. There is no mass.     Tenderness: There is no abdominal tenderness. There is no guarding or rebound.     Hernia: No hernia is present.  Musculoskeletal:        General: No tenderness. Normal range of motion.     Cervical back: Normal range of motion and neck supple.  Lymphadenopathy:     Cervical: No cervical adenopathy.  Skin:    General: Skin is warm and dry.     Capillary Refill: Capillary refill takes less than 2 seconds.  Neurological:     General: No focal deficit present.     Mental Status: He is alert and oriented to person, place, and time.  Psychiatric:        Mood and Affect: Mood normal.        Behavior: Behavior normal.        Thought Content: Thought content normal.        Judgment: Judgment normal.       Assessment & Plan:  1. Routine general medical examination at a health care facility (Primary) Today patient counseled on age appropriate routine health concerns for screening and prevention, each reviewed and up to date or declined. Immunizations reviewed and up to date or declined. Labs ordered and reviewed. Risk factors for depression reviewed and negative. Hearing function and visual acuity are intact. ADLs screened and addressed as needed. Functional ability and level of safety reviewed and appropriate. Education, counseling and  referrals performed based on assessed risks today. Patient provided with a copy of personalized plan for preventive services.   2. Anxiety - Continue with Zoloft - controlled - ondansetron  (ZOFRAN  ODT) 4 MG disintegrating tablet; Take 1 tablet (4 mg total) by mouth every 8 (eight) hours as needed for nausea or vomiting.  Dispense: 20 tablet; Refill: 3  3. Pre-diabetes - Consider Metformin  - Lipid panel; Future - TSH; Future - CBC; Future - Comprehensive metabolic panel with GFR; Future - Hemoglobin A1c; Future - Hemoglobin A1c - Comprehensive metabolic panel with GFR - CBC - TSH - Lipid panel  4. Mixed hyperlipidemia - Consider increase in statin  - Lipid panel; Future - TSH; Future - CBC; Future - Comprehensive metabolic panel with GFR; Future - Comprehensive metabolic panel with GFR - CBC - TSH - Lipid panel  5. Migraine with aura and with status migrainosus, not intractable - Controlled.  - Lipid panel; Future - TSH; Future - CBC; Future - Comprehensive metabolic panel with GFR; Future - ondansetron  (ZOFRAN  ODT) 4 MG disintegrating tablet; Take 1 tablet (4 mg total) by mouth every 8 (eight) hours as needed for nausea or vomiting.  Dispense: 20 tablet; Refill: 3 - Comprehensive metabolic panel with GFR - CBC - TSH - Lipid panel  6. Class 2 obesity - Continue to work on weight loss measures through lifestyle modifications  - Lipid panel; Future - TSH; Future - CBC; Future - Comprehensive metabolic panel with GFR; Future - Comprehensive metabolic panel with GFR - CBC - TSH - Lipid panel  7. Need for influenza vaccination  - Flu vaccine trivalent PF, 6mos and older(Flulaval,Afluria,Fluarix,Fluzone)  8. Need for HPV vaccination  - HPV 9-valent vaccine,Recombinat  9. Dislocation of temporomandibular joint, subsequent encounter  - Ambulatory referral to ENT  [facility]  Rhyder Koegel, NP     [1] No Known Allergies [2]  Current Outpatient Medications on File  Prior to Visit  Medication Sig Dispense Refill   aspirin-acetaminophen-caffeine (EXCEDRIN MIGRAINE) 250-250-65 MG tablet Take 1 tablet by mouth every 6 (six) hours as needed for headache or migraine.     atorvastatin  (LIPITOR) 10 MG tablet TAKE 1 TABLET BY MOUTH EVERY DAY 90 tablet 2   ondansetron  (ZOFRAN  ODT) 4 MG disintegrating tablet Take 1 tablet (4 mg total) by mouth every 8 (eight) hours as needed for nausea or vomiting. 20 tablet 0   sertraline  (ZOLOFT ) 50 MG tablet TAKE 1 TABLET BY MOUTH EVERY DAY 90 tablet 1   No current facility-administered medications on file prior to visit.

## 2024-10-29 NOTE — Patient Instructions (Addendum)
 It was great seeing you today   We will follow up with you regarding your lab work   Please let me know if you need anything   I have referred you to ENT - the will call you   Follow up in two months for your second HPV vaccination and 6 months for the third shot

## 2024-11-05 ENCOUNTER — Other Ambulatory Visit: Payer: Self-pay | Admitting: Adult Health

## 2024-11-05 DIAGNOSIS — S0300XD Dislocation of jaw, unspecified side, subsequent encounter: Secondary | ICD-10-CM

## 2024-12-30 ENCOUNTER — Ambulatory Visit
